# Patient Record
Sex: Female | Born: 1950 | Race: White | Hispanic: No | Smoking: Never smoker
Health system: Southern US, Community
[De-identification: ages and names within clinical notes are randomized; demographics above are authoritative.]

## PROBLEM LIST (undated history)

## (undated) DIAGNOSIS — I10 Essential (primary) hypertension: Secondary | ICD-10-CM

## (undated) DIAGNOSIS — R42 Dizziness and giddiness: Secondary | ICD-10-CM

## (undated) DIAGNOSIS — Z1159 Encounter for screening for other viral diseases: Secondary | ICD-10-CM

## (undated) DIAGNOSIS — K219 Gastro-esophageal reflux disease without esophagitis: Principal | ICD-10-CM

## (undated) DIAGNOSIS — R2689 Other abnormalities of gait and mobility: Principal | ICD-10-CM

## (undated) DIAGNOSIS — F419 Anxiety disorder, unspecified: Secondary | ICD-10-CM

---

## 2012-10-19 ENCOUNTER — Emergency Department (HOSPITAL_COMMUNITY): Payer: Medicare HMO

## 2012-10-19 ENCOUNTER — Emergency Department (HOSPITAL_COMMUNITY)
Admission: EM | Admit: 2012-10-19 | Discharge: 2012-10-21 | Disposition: A | Discharge status: Other Acute Inpatient Hospital | Payer: Medicare HMO | Attending: Emergency Medicine | Admitting: Emergency Medicine

## 2012-10-19 ENCOUNTER — Encounter (HOSPITAL_COMMUNITY): Payer: Self-pay | Admitting: Emergency Medicine

## 2012-10-19 DIAGNOSIS — I10 Essential (primary) hypertension: Secondary | ICD-10-CM | POA: Insufficient documentation

## 2012-10-19 DIAGNOSIS — F29 Unspecified psychosis not due to a substance or known physiological condition: Secondary | ICD-10-CM | POA: Insufficient documentation

## 2012-10-19 DIAGNOSIS — Z880 Allergy status to penicillin: Secondary | ICD-10-CM | POA: Insufficient documentation

## 2012-10-19 DIAGNOSIS — N949 Unspecified condition associated with female genital organs and menstrual cycle: Secondary | ICD-10-CM | POA: Insufficient documentation

## 2012-10-19 DIAGNOSIS — K6289 Other specified diseases of anus and rectum: Secondary | ICD-10-CM | POA: Insufficient documentation

## 2012-10-19 HISTORY — DX: Essential (primary) hypertension: I10

## 2012-10-19 LAB — CBC WITH DIFFERENTIAL/PLATELET
Basophils Relative: 0 % (ref 0–1)
HCT: 40.5 % (ref 36.0–46.0)
Hemoglobin: 14.1 g/dL (ref 12.0–15.0)
Lymphocytes Relative: 26 % (ref 12–46)
Lymphs Abs: 2.2 10*3/uL (ref 0.7–4.0)
MCHC: 34.8 g/dL (ref 30.0–36.0)
Monocytes Relative: 7 % (ref 3–12)
Neutro Abs: 4.8 10*3/uL (ref 1.7–7.7)
Neutrophils Relative %: 58 % (ref 43–77)
RBC: 4.52 MIL/uL (ref 3.87–5.11)
WBC: 8.3 10*3/uL (ref 4.0–10.5)

## 2012-10-19 LAB — COMPREHENSIVE METABOLIC PANEL
Albumin: 4 g/dL (ref 3.5–5.2)
Alkaline Phosphatase: 86 U/L (ref 39–117)
BUN: 15 mg/dL (ref 6–23)
CO2: 29 mEq/L (ref 19–32)
Chloride: 100 mEq/L (ref 96–112)
GFR calc non Af Amer: 61 mL/min — ABNORMAL LOW (ref >90)
Potassium: 3.6 mEq/L (ref 3.5–5.1)
Total Bilirubin: 0.3 mg/dL (ref 0.3–1.2)

## 2012-10-19 LAB — GLUCOSE, CAPILLARY: Glucose-Capillary: 122 mg/dL — ABNORMAL HIGH (ref 70–99)
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
## 2012-10-19 NOTE — ED Notes (Signed)
Assumed care of patient at this time. Officer at the bedside at this time. Labs and urine ordered. Pt assessed by MD. PT undressed assisted MD to check pt bottom. Pt check by security.
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
## 2012-10-20 LAB — RAPID URINE DRUG SCREEN, HOSP PERFORMED
Barbiturates: NOT DETECTED
Opiates: NOT DETECTED
Tetrahydrocannabinol: NOT DETECTED

## 2012-10-20 LAB — URINALYSIS, ROUTINE W REFLEX MICROSCOPIC
Glucose, UA: NEGATIVE mg/dL
Leukocytes, UA: NEGATIVE
Protein, ur: NEGATIVE mg/dL
Urobilinogen, UA: 0.2 mg/dL (ref 0.0–1.0)

## 2012-10-20 MED ORDER — LORAZEPAM 1 MG PO TABS: 1.0000 mg | ORAL_TABLET | Freq: Three times a day (TID) | ORAL | Status: DC | PRN

## 2012-10-20 MED ORDER — DIAZEPAM 5 MG PO TABS
5.0000 mg | ORAL_TABLET | Freq: Three times a day (TID) | ORAL | Status: DC
  Administered 2012-10-20 – 2012-10-21 (×4): 5 mg via ORAL
  Filled 2012-10-20 (×4): qty 1

## 2012-10-20 MED ORDER — DILTIAZEM HCL ER COATED BEADS 240 MG PO CP24
240.0000 mg | ORAL_CAPSULE | Freq: Every day | ORAL | Status: DC
  Administered 2012-10-20 – 2012-10-21 (×2): 240 mg via ORAL
  Filled 2012-10-20 (×3): qty 1

## 2012-10-20 MED ORDER — ONDANSETRON HCL 4 MG PO TABS: 4.0000 mg | ORAL_TABLET | Freq: Three times a day (TID) | ORAL | Status: DC | PRN

## 2012-10-20 MED ORDER — ACETAMINOPHEN 325 MG PO TABS: 650.0000 mg | ORAL_TABLET | ORAL | Status: DC | PRN

## 2012-10-20 MED ORDER — MECLIZINE HCL 12.5 MG PO TABS
25.0000 mg | ORAL_TABLET | Freq: Three times a day (TID) | ORAL | Status: DC
  Administered 2012-10-20 – 2012-10-21 (×4): 25 mg via ORAL
  Filled 2012-10-20: qty 1
  Filled 2012-10-20 (×3): qty 2

## 2012-10-20 MED ORDER — TRIAMTERENE-HCTZ 37.5-25 MG PO TABS
1.0000 | ORAL_TABLET | Freq: Every day | ORAL | Status: DC
  Administered 2012-10-20 – 2012-10-21 (×2): 1 via ORAL
  Filled 2012-10-20 (×3): qty 1

## 2012-10-20 MED ORDER — MELOXICAM 7.5 MG PO TABS
7.5000 mg | ORAL_TABLET | Freq: Two times a day (BID) | ORAL | Status: DC
  Administered 2012-10-20 – 2012-10-21 (×3): 7.5 mg via ORAL
  Filled 2012-10-20 (×5): qty 1

## 2012-10-20 MED ORDER — POTASSIUM CHLORIDE CRYS ER 20 MEQ PO TBCR
20.0000 meq | EXTENDED_RELEASE_TABLET | Freq: Two times a day (BID) | ORAL | Status: DC
  Administered 2012-10-20 – 2012-10-21 (×3): 20 meq via ORAL
  Filled 2012-10-20 (×3): qty 1

## 2012-10-20 NOTE — ED Notes (Signed)
Pt daughter and pt's daughter caretakers just left pt bedside and left contact information to receive pt updates. Pt's daughter's caretakers, Channing Mutters and Dianna Rossetti, can be reached at [phone].

## 2012-10-20 NOTE — ED Notes (Signed)
[employer] with pt contacted supervisor and found out that pt daughter was seen at [facility] but is no longer there. Pt aware via [employer] officer. Pt verbalized understanding.

## 2012-10-20 NOTE — ED Notes (Signed)
Pt refusing vitals, would not allow tech to take, threw blanket in the floor.

## 2012-10-20 NOTE — ED Notes (Signed)
Ask pt about home med list, pt will not tell us what she takes, states she does not want food, water, will not take any medication. Pt will not let me turn lights out, she wants her daughter feels like her daughter is not been cared for.
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
## 2012-10-20 NOTE — BH Assessment (Addendum)
[facility] geropsych unit has available beds per Trey Paula. Writer faxed referral to [facility] at [phone]. Earlene Plater Reg called back to ask for IVC paperwork. Writer called pt's RN Victorino Dike and Victorino Dike will fax IVC paperwork to Eber Jones [phone]  At capacity: [facility]  Evette Cristal, [place] Assessment Counselor

## 2012-10-20 NOTE — ED Provider Notes (Signed)
Pt continues delusional. IVC paperwork completed. Police at bedside. Pending placement at [facility] 400 hall bed.   Laray Anger, DO 10/20/12 [phone]

## 2012-10-20 NOTE — ED Notes (Addendum)
Pt talking with [facility] department and voiced concern for her daughter. Pt requesting to speak to a Child psychotherapist. Spoke with Press photographer. Charge Nurse reported Child psychotherapist comes in around 830. Pt informed social worker will get here around 830. Pt verbalized understanding.

## 2012-10-20 NOTE — ED Notes (Signed)
Pt daughter and caretakers at bedside. [facility] at bedside.

## 2012-10-20 NOTE — ED Notes (Signed)
telepscych in progress

## 2012-10-20 NOTE — ED Notes (Signed)
Still trying to get in touch with case management.

## 2012-10-20 NOTE — ED Notes (Signed)
Behavioral health called they will work on placement. Spoke with Dr Bebe Shaggy per MD, secretary is working on [employer]. Pt is crying she feels like we are putting her away to take away her daughter. Per officer daughter had a seizure after pt was taken away. She was taken to [facility] hosp.
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
## 2012-10-20 NOTE — ED Notes (Addendum)
Called [facility] for update on pt placement. Tom reported pt is accepted to 400 hall at [facility]. Tom reported no discharges on 400 hall and is unsure when the next discharge will be from that hall. Tom reported would call back if bed became available. RSD and Charge aware.
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
## 2012-10-20 NOTE — ED Notes (Addendum)
Per [facility], [place] requesting IVC paperwork in order to see if pt qualifies for treatment at their facility.

## 2012-10-20 NOTE — ED Notes (Signed)
Patient now hungry - meal prepared (healthy choice frozen dinner)

## 2012-10-20 NOTE — ED Notes (Signed)
Pt in hospital gown, pt to large of paper scrubs, ambulatory to the bathroom, pt refuse socks, officer assisted with pt.

## 2012-10-20 NOTE — ED Notes (Signed)
Called social work and inquired about pt's concern of her daughter. Social work reported there wasn't anything that they could do at this time. Pt informed that once case manager arrives at 9 the question will be addressed then as well. Pt informed and verbalized understanding.

## 2012-10-21 NOTE — ED Notes (Signed)
Pt requesting to speak to someone about taking care of her daughter while she is committed.  Contacted case management.

## 2012-10-21 NOTE — ED Notes (Signed)
Walked with patient to restroom and back to her room with no issues. Patient is very pleasant and talkative at this time. States that she enjoyed the drink and graham crackers the nurse gave her.

## 2012-10-21 NOTE — ED Notes (Signed)
Ate 100% of meal

## 2012-10-21 NOTE — BH Assessment (Addendum)
[facility] Assessment Progress Note Update:  Although pt has been accepted pending a bed at [facility], this clinician also called the following facilities to consider pt for placement:  [facility] Bride per [place]arl[place] @ [phone] - Referral faxed for review [facility] - No beds per [facility] @ [address] per [place] @ [phone] - Referral faxed for review [facility] - No beds @ 0944 [facility] - No beds per [facility] @ [address] - Beds per [place] @ [phone] - Referral faxed for review [place] - [phone] - Beds per Marchelle Folks - Referral faxed for review [place] - No beds per [facility] @ 1027  TTS staff will continue to follow up on pt referrals.

## 2012-10-21 NOTE — ED Notes (Signed)
Assisted pt to restroom  

## 2012-10-21 NOTE — ED Provider Notes (Signed)
Patient accepted to [facility] by Dr. Rowe Clack. BP 188/80  Pulse 68  Temp(Src) 97.9 F (36.6 C) (Oral)  Resp 20  Ht 5\' 6"  (1.676 m)  Wt 280 lb (127.007 kg)  BMI 45.21 kg/m2  SpO2 98%   Glynn Octave, MD 10/21/12 2224

## 2012-10-21 NOTE — ED Notes (Signed)
Spoke with Baxter Hire at [facility] - will talk with her [place] about pt placement and call back with bed assignment.

## 2012-10-22 ENCOUNTER — Encounter (HOSPITAL_COMMUNITY): Payer: Self-pay

## 2012-10-22 ENCOUNTER — Inpatient Hospital Stay (HOSPITAL_COMMUNITY)
Admission: AD | Admit: 2012-10-22 | Discharge: 2012-10-27 | DRG: 885 | Disposition: A | Discharge status: Home / Self Care | Payer: Medicare HMO | Source: Intra-hospital | Attending: Psychiatry | Admitting: Psychiatry

## 2012-10-22 DIAGNOSIS — F3110 Bipolar disorder, current episode manic without psychotic features, unspecified: Secondary | ICD-10-CM | POA: Diagnosis present

## 2012-10-22 DIAGNOSIS — F22 Delusional disorders: Secondary | ICD-10-CM | POA: Diagnosis present

## 2012-10-22 DIAGNOSIS — I10 Essential (primary) hypertension: Secondary | ICD-10-CM | POA: Diagnosis present

## 2012-10-22 MED ORDER — DILTIAZEM HCL ER COATED BEADS 240 MG PO CP24
240.0000 mg | ORAL_CAPSULE | Freq: Every day | ORAL | Status: DC
  Administered 2012-10-22 – 2012-10-25 (×4): 240 mg via ORAL
  Filled 2012-10-22 (×6): qty 1

## 2012-10-22 MED ORDER — ACETAMINOPHEN 325 MG PO TABS
650.0000 mg | ORAL_TABLET | Freq: Four times a day (QID) | ORAL | Status: DC | PRN
  Administered 2012-10-22 – 2012-10-26 (×9): 650 mg via ORAL

## 2012-10-22 MED ORDER — RISPERIDONE 1 MG PO TBDP
1.0000 mg | ORAL_TABLET | Freq: Two times a day (BID) | ORAL | Status: DC
  Administered 2012-10-22 – 2012-10-24 (×4): 1 mg via ORAL
  Filled 2012-10-22 (×9): qty 1

## 2012-10-22 MED ORDER — TRIAMTERENE-HCTZ 37.5-25 MG PO TABS
1.0000 | ORAL_TABLET | Freq: Every day | ORAL | Status: DC
  Administered 2012-10-22 – 2012-10-25 (×4): 1 via ORAL
  Filled 2012-10-22 (×6): qty 1

## 2012-10-22 MED ORDER — TRAZODONE HCL 50 MG PO TABS
50.0000 mg | ORAL_TABLET | Freq: Every evening | ORAL | Status: DC | PRN
  Administered 2012-10-22 – 2012-10-26 (×5): 50 mg via ORAL
  Filled 2012-10-22 (×2): qty 1
  Filled 2012-10-22: qty 6
  Filled 2012-10-22 (×2): qty 1

## 2012-10-22 MED ORDER — MECLIZINE HCL 25 MG PO TABS
25.0000 mg | ORAL_TABLET | Freq: Three times a day (TID) | ORAL | Status: DC
  Administered 2012-10-22 – 2012-10-25 (×12): 25 mg via ORAL
  Filled 2012-10-22 (×16): qty 1

## 2012-10-22 MED ORDER — ALUM & MAG HYDROXIDE-SIMETH 200-200-20 MG/5ML PO SUSP: 30.0000 mL | ORAL | Status: DC | PRN

## 2012-10-22 MED ORDER — MELOXICAM 7.5 MG PO TABS
7.5000 mg | ORAL_TABLET | Freq: Two times a day (BID) | ORAL | Status: DC
  Administered 2012-10-22 (×2): 7.5 mg via ORAL
  Filled 2012-10-22 (×4): qty 1

## 2012-10-22 MED ORDER — DIAZEPAM 5 MG PO TABS
5.0000 mg | ORAL_TABLET | Freq: Three times a day (TID) | ORAL | Status: DC
  Administered 2012-10-22 – 2012-10-25 (×12): 5 mg via ORAL
  Filled 2012-10-22 (×12): qty 1

## 2012-10-22 MED ORDER — MELOXICAM 15 MG PO TABS
15.0000 mg | ORAL_TABLET | Freq: Two times a day (BID) | ORAL | Status: DC
  Administered 2012-10-23: 15 mg via ORAL
  Filled 2012-10-22 (×3): qty 1

## 2012-10-22 MED ORDER — MAGNESIUM HYDROXIDE 400 MG/5ML PO SUSP
30.0000 mL | Freq: Every day | ORAL | Status: DC | PRN
  Administered 2012-10-22 – 2012-10-23 (×2): 30 mL via ORAL

## 2012-10-22 NOTE — Progress Notes (Signed)
D-Patient is pleasant but speech is tangental and pressured.  She rambles from topic to topic.  She is compliant with scheduled medications and no prn medications requested.  She rates depression and hopelessness at 0 and denies SI.  Remains a fall risk and using walker.  A- Support and encouragement offered.  Continue POC and evaluation of treatment goals. Continue 15' checks for safety.  Redirect mania with therapeutic communication.  R- Remains safe.

## 2012-10-22 NOTE — Progress Notes (Addendum)
Provider informed of increased BP.  Will continue to assess manual BP's and report to oncoming shfit.  Patient is alert and denies HA and blurred vision.

## 2012-10-22 NOTE — [facility] Suicide Risk Assessment (Signed)
Suicide Risk Assessment  Admission Assessment     Nursing information obtained from:  Patient Demographic factors:  Caucasian;Unemployed;Access to firearms Current Mental Status:  NA Loss Factors:  Financial problems / change in socioeconomic status;Legal issues Historical Factors:  Impulsivity Risk Reduction Factors:  Positive social support  CLINICAL FACTORS:   Depression:   Impulsivity. Paranoid and delusional. Impaired insight.  COGNITIVE FEATURES THAT CONTRIBUTE TO RISK:  Polarized thinking    SUICIDE RISK:   Severe:  Frequent, intense, and enduring suicidal ideation, specific plan, no subjective intent, but some objective markers of intent (i.e., choice of lethal method), the method is accessible, some limited preparatory behavior, evidence of impaired self-control, severe dysphoria/symptomatology, multiple risk factors present, and few if any protective factors, particularly a lack of social support.  PLAN OF CARE:  I certify that inpatient services furnished can reasonably be expected to improve the patient's condition.  Brittney Mitchell 10/22/2012, 12:18 PM
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
## 2012-10-22 NOTE — Tx Team (Signed)
Initial Interdisciplinary Treatment Plan  PATIENT STRENGTHS: (choose at least two) General fund of knowledge Supportive family/friends  PATIENT STRESSORS: Health problems Medication change or noncompliance   PROBLEM LIST: Problem List/Patient Goals Date to be addressed Date deferred Reason deferred Estimated date of resolution  Psychosis 10/22/12     Depression 10/22/12     Risk for suicide 10/22/12                                          DISCHARGE CRITERIA:  Adequate post-discharge living arrangements Improved stabilization in mood, thinking, and/or behavior  PRELIMINARY DISCHARGE PLAN: Attend PHP/IOP Outpatient therapy  PATIENT/FAMIILY INVOLVEMENT: This treatment plan has been presented to and reviewed with the patient, Brittney Mitchell.  The patient and family have been given the opportunity to ask questions and make suggestions.  Jacques Navy A 10/22/2012, 3:25 AM

## 2012-10-22 NOTE — [facility] Group Notes (Signed)
[facility] Group Notes:  (Clinical Social Work)  10/22/2012   2:00-2:30PM  Summary of Progress/Problems:   The main focus of today's process group was to explain to the adolescent what "self-sabotage" means and use Motivational Interviewing to discuss what benefits, negative or positive, were involved in a self-identified self-sabotaging behavior.  We then talked about reasons the patient may want to change the behavior and her current desire to change.  A scaling question was used to help patient look at where they are now in motivation for change, from 1 to 10 (lowest to highest motivation).  The patient expressed that when she has a problem or feels bad, she tells herself that if she talks about it, nobody will listen, so she just keeps things to herself.  She nodded to many other comments made by other patients in group, as though she agreed.  She smiled incongruently with her depressed mood.  Type of Therapy:  Group Therapy - Process   Participation Level:  Active  Participation Quality:  Attentive  Affect:  Not Congruent  Cognitive:  Confused  Insight:  Improving  Engagement in Therapy:  Engaged  Modes of Intervention:  Support and Processing, Exploration, Discussion  Ambrose Mantle, LCSW 10/22/2012, 12:39 PM
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
## 2012-10-22 NOTE — [facility] Group Notes (Signed)
[facility] Group Notes:  (Nursing/MHT/Case Management/Adjunct)  Date:  10/22/2012  Time:  10:49 AM  Type of Therapy:  Nurse Education  Participation Level:  Did Not Attend Participation Quality:    Affect:    Cognitive:    Insight:    Engagement in Group:    Modes of Intervention:    Summary of Progress/Problems:  Cresenciano Lick 10/22/2012, 10:49 AM

## 2012-10-23 MED ORDER — MELOXICAM 7.5 MG PO TABS
7.5000 mg | ORAL_TABLET | Freq: Two times a day (BID) | ORAL | Status: DC
  Administered 2012-10-23 – 2012-10-25 (×5): 7.5 mg via ORAL
  Filled 2012-10-23 (×8): qty 1

## 2012-10-23 NOTE — Progress Notes (Signed)
D- Patient is bright and appropriate. She is attending groups with enthusiasm.  Although she is logical and coherent, she exhibits some hypomania but no redirection  Is necessary.  C/O knee pain r/t arthritis.  Covered well by scheduled mobic.  A- Support and encouragement given.  Continue current POC and evaluation of treatment goals. Continue 15' checks for safety.  R- Safety maintained.

## 2012-10-23 NOTE — [facility] Group Notes (Signed)
[facility] Group Notes:  (Clinical Social Work)  10/23/2012   11:15am-12:00pm  Summary of Progress/Problems:  The main focus of today's process group was to listen to a variety of genres of music and to identify that different types of music provoke different responses.  The patient then was able to identify personally what was soothing for them, as well as energizing.  Handouts were used to record feelings evoked, as well as how patient can personally use this knowledge in sleep habits, with depression, and with other symptoms.  The patient expressed understanding of concepts, as well as knowledge of how each type of music affected them and how this can be used when they are at home as a tool in their recovery.  She enjoyed the [employer] a great deal, and smiked throughout group.  Type of Therapy:  Music Therapy   Participation Level:  Active  Participation Quality:  Attentive and Sharing  Affect:  Blunted  Cognitive:  Oriented  Insight:  Engaged  Engagement in Therapy:  Engaged  Modes of Intervention:   Activity, Exploration  Ambrose Mantle, LCSW 10/23/2012, 12:48 PM

## 2012-10-23 NOTE — Progress Notes (Signed)
[facility] Group Notes:  (Nursing/MHT/Case Management/Adjunct)  Date:  10/23/2012  Time:  2000  Type of Therapy:  Psychoeducational Skills  Participation Level:  Active  Participation Quality:  Appropriate  Affect:  Appropriate  Cognitive:  Appropriate  Insight:  Improving  Engagement in Group:  Improving  Modes of Intervention:  Education  Summary of Progress/Problems: The patient stated that she had a "great day". She mentioned that her knees are no longer causing her any physical discomfort. In addition, she mentioned that she had a good over the telephone with her family. Her goal for tomorrow is to have another good day.   Hazle Coca S 10/23/2012, 11:35 PM
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
## 2012-10-23 NOTE — Progress Notes (Signed)
[facility] MD Progress Note  10/23/2012 11:26 AM NATAYAH Mitchell  MRN:  [id number] Subjective:  Sat down with no distress.  Objective: Mood elevated with poor insight why she is here. Still feels writing a book and that her daughter was in danger. Somewhat delusional and elevated mood. Diagnosis:   DSM5: Schizophrenia Disorders:  Delusional Disorder (297.1) Obsessive-Compulsive Disorders:  NA Trauma-Stressor Disorders:  NA Substance/Addictive Disorders:  NA Depressive Disorders:  Disruptive Mood Dysregulation Disorder (296.99)  Axis I: Bipolar, Manic and Psychotic Disorder NOS  ADL's:  Impaired  Sleep: Fair  Appetite:  Fair  Suicidal Ideation:  Plan:  none Intent:  intent Homicidal Ideation:  Plan:  none AEB (as evidenced by):  Psychiatric Specialty Exam: Review of Systems  Psychiatric/Behavioral: Positive for depression.    Blood pressure 128/77, pulse 78, temperature 97.8 F (36.6 C), temperature source Oral, resp. rate 24, height 5\' 6"  (1.676 m), weight 130.636 kg (288 lb).Body mass index is 46.51 kg/(m^2).  General Appearance: Casual  Eye Contact::  Good  Speech:  Clear and Coherent  Volume:  Increased  Mood:  Euphoric  Affect:  Congruent  Thought Process:  Disorganized  Orientation:  Full (Time, Place, and Person)  Thought Content:  Delusions  Suicidal Thoughts:  No  Homicidal Thoughts:  No  Memory:  Immediate;   Fair  Judgement:  Impaired  Insight:  Lacking  Psychomotor Activity:  Normal  Concentration:  Fair  Recall:  Fair  Akathisia:  No  Handed:  Right  AIMS (if indicated):     Assets:  Desire for Improvement  Sleep:  Number of Hours: 6.75   Current Medications: Current Facility-Administered Medications  Medication Dose Route Frequency Provider Last Rate Last Dose  . acetaminophen (TYLENOL) tablet 650 mg  650 mg Oral Q6H PRN Sanjuana Kava, NP   650 mg at 10/23/12 0640  . alum & mag hydroxide-simeth (MAALOX/MYLANTA) 200-200-20 MG/5ML suspension 30 mL  30  mL Oral Q4H PRN Court Joy, PA-C      . diazepam (VALIUM) tablet 5 mg  5 mg Oral TID Court Joy, PA-C   5 mg at 10/23/12 0751  . diltiazem (CARDIZEM CD) 24 hr capsule 240 mg  240 mg Oral Daily Court Joy, PA-C   240 mg at 10/23/12 0751  . magnesium hydroxide (MILK OF MAGNESIA) suspension 30 mL  30 mL Oral Daily PRN Court Joy, PA-C   30 mL at 10/22/12 2240  . meclizine (ANTIVERT) tablet 25 mg  25 mg Oral TID Court Joy, PA-C   25 mg at 10/23/12 0751  . meloxicam (MOBIC) tablet 15 mg  15 mg Oral BID Nanine Means, NP   15 mg at 10/23/12 0750  . risperiDONE (RISPERDAL M-TABS) disintegrating tablet 1 mg  1 mg Oral BID Nanine Means, NP   1 mg at 10/23/12 0751  . traZODone (DESYREL) tablet 50 mg  50 mg Oral QHS PRN,MR X 1 Sanjuana Kava, NP   50 mg at 10/22/12 2229  . triamterene-hydrochlorothiazide (MAXZIDE-25) 37.5-25 MG per tablet 1 tablet  1 tablet Oral Daily Court Joy, PA-C   1 tablet at 10/23/12 4098    Lab Results: No results found for this or any previous visit (from the past 48 hour(s)).  Physical Findings: AIMS: Facial and Oral Movements Muscles of Facial Expression: None, normal Lips and Perioral Area: None, normal Jaw: None, normal Tongue: None, normal,Extremity Movements Upper (arms, wrists, hands, fingers): None, normal Lower (legs, knees, ankles, toes):  None, normal, Trunk Movements Neck, shoulders, hips: None, normal, Overall Severity Severity of abnormal movements (highest score from questions above): None, normal Incapacitation due to abnormal movements: None, normal Patient's awareness of abnormal movements (rate only patient's report): No Awareness, Dental Status Current problems with teeth and/or dentures?: No Does patient usually wear dentures?: Yes (partial upper)  CIWA:    COWS:     Treatment Plan Summary: Daily contact with patient to assess and evaluate symptoms and progress in treatment Medication management. Have started on  Risperdal M tablet which has shown benefit in sleep and mood symptoms. Less evasive now.. Vitals stable.  Plan: Continue above treatment   Medical Decision Making Problem Points:  Established problem, stable/improving (1), Review of last therapy session (1) and Review of psycho-social stressors (1) Data Points:  Review or order clinical lab tests (1) Review or order medicine tests (1) Review of medication regiment & side effects (2)  I certify that inpatient services furnished can reasonably be expected to improve the patient's condition.   [name] 10/23/2012, 11:26 AM

## 2012-10-24 DIAGNOSIS — F29 Unspecified psychosis not due to a substance or known physiological condition: Secondary | ICD-10-CM

## 2012-10-24 DIAGNOSIS — F3110 Bipolar disorder, current episode manic without psychotic features, unspecified: Secondary | ICD-10-CM

## 2012-10-24 MED ORDER — RISPERIDONE 2 MG PO TBDP
2.0000 mg | ORAL_TABLET | Freq: Two times a day (BID) | ORAL | Status: DC
  Administered 2012-10-24 – 2012-10-25 (×3): 2 mg via ORAL
  Filled 2012-10-24 (×6): qty 1
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
## 2012-10-24 NOTE — Progress Notes (Signed)
Recreation Therapy Notes  Date: 09.08.2014 Time: 9:30am Location: [address] Dayroom  Group Topic: Wellness  Goal Area(s) Addresses:  Patient will define components of whole wellness. Patient will verbalize benefit of whole wellness.  Behavioral Response: Engaged, Attentive, Appropriate  Intervention: Air traffic controller  Activity: [employer]. Patients were given a worksheet with the six dimensions of wellness: Physical, Social, Intellectual, Emotional, Environmental, and Spiritual. Patients were asked to identify two ways they address each dimension.   Education: Wellness, Building control surveyor.   Education Outcome: Acknowledges understanding.   Clinical Observations/Feedback: Patient participated in opening discussion, sharing that wellness means being able to get up and walk around with no pain. Patient completed worksheet as requested, but did not share with group. Patient did not contribute to group discussion, but appeared to actively listen as she maintained appropriate eye contact with speaker.   Marykay Lex Christien Berthelot, LRT/CTRS  Arliss Hepburn L 10/24/2012 1:57 PM

## 2012-10-24 NOTE — Progress Notes (Signed)
Adult Psychoeducational Group Note  Date:  10/24/2012 Time:  1:46 PM  Group Topic/Focus:  Dimensions of Wellness:   The focus of this group is to introduce the topic of wellness and discuss the role each dimension of wellness plays in total health.  Participation Level:  Active  Participation Quality:  Appropriate  Affect:  Appropriate  Cognitive:  Alert and Appropriate  Insight: Appropriate  Engagement in Group:  Engaged  Modes of Intervention:  Discussion  Additional Comments:  Pt was active and alert during group and was very engaged. Pt mentioned that her social goal is to spend more time with her daughter and to lose some weight.  [place] Shi K 10/24/2012, 1:46 PM

## 2012-10-24 NOTE — Progress Notes (Signed)
Patient in day room interacting with peers at the beginning of the shift. Her mood and affect bright and appropriate. She talked about the event that brought her to the hospital. York Spaniel her neighbor is "[employer]" and very hostile. Patient denied SI/HI and denied hallucinations. Q 15 minute check continues as ordered to maintain safety.
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
## 2012-10-24 NOTE — Progress Notes (Signed)
Pt has had a good evening though feels mobic is not covering her arthritic knee pain. Attended group and interacted with peers appropriately. Mood is elevated with some anxiety. Affect bright. Pt medicated with tyelnol and trazadone with good results. Pt assisted into the bath tub and clothes were obtained for her from the unit's donated items. Pt very appreciative. Ambulating with walker and verbalizes understanding of fall precautions. Denies SI/HI/aVH and remains safe. Lawrence Marseilles

## 2012-10-24 NOTE — Progress Notes (Addendum)
Patient ID: Brittney Mitchell, female   DOB: 1950/06/11, 62 y.o.   MRN: [id number] D:Patient presents with bright affect.  She is interacting well with staff and others. Patient is attending groups and participating in her treatment.  She remains paranoid of her neighbor thinking he is spying on her. Believes that her neighbor is a member of the [employer]. She denies any HI/SI/AVH. She complained of pain due to her arthritis, however states that mobic is working well.  A:She continues to ambulate with a walker to due fall risk.  Continue to monitor medication management and MD orders.  Safety checks completed every 15 minutes per protocol.  R:Patient's behavior has been appropriate.

## 2012-10-24 NOTE — [facility] Group Notes (Signed)
[facility] LCSW Group Therapy  10/24/2012 1:15 pm  Type of Therapy: Process Group Therapy  Participation Level:  Active  Participation Quality:  Appropriate  Affect:  Flat  Cognitive:  Oriented  Insight:  Limited  Engagement in Group:  Limited  Engagement in Therapy:  Limited  Modes of Intervention:  Activity, Clarification, Education, Problem-solving and Support  Summary of Progress/Problems: Today's group addressed the issue of overcoming obstacles.  Patients were asked to identify their biggest obstacle post d/c that stands in the way of their on-going success, and then problem solve as to how to manage this.  Brittney Mitchell continued her theme of paranoia by talking about how the media does not like Jews and "we must all protect ourselves."  However, she also is very invested in giving supportive feedback to others, which is received in the manner in which it is intended.   Brittney Mitchell 10/24/2012   4:28 PM
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
## 2012-10-25 MED ORDER — DIAZEPAM 5 MG PO TABS
5.0000 mg | ORAL_TABLET | Freq: Three times a day (TID) | ORAL | Status: DC
  Administered 2012-10-26 – 2012-10-27 (×5): 5 mg via ORAL
  Filled 2012-10-25 (×5): qty 1

## 2012-10-25 MED ORDER — MELOXICAM 7.5 MG PO TABS
7.5000 mg | ORAL_TABLET | Freq: Two times a day (BID) | ORAL | Status: DC
  Administered 2012-10-26 – 2012-10-27 (×3): 7.5 mg via ORAL
  Filled 2012-10-25 (×7): qty 1

## 2012-10-25 MED ORDER — MECLIZINE HCL 25 MG PO TABS
25.0000 mg | ORAL_TABLET | Freq: Three times a day (TID) | ORAL | Status: DC
  Administered 2012-10-26 – 2012-10-27 (×5): 25 mg via ORAL
  Filled 2012-10-25 (×10): qty 1

## 2012-10-25 MED ORDER — RISPERIDONE 2 MG PO TBDP
2.0000 mg | ORAL_TABLET | Freq: Two times a day (BID) | ORAL | Status: DC
  Administered 2012-10-26 – 2012-10-27 (×3): 2 mg via ORAL
  Filled 2012-10-25 (×3): qty 1
  Filled 2012-10-25 (×2): qty 6
  Filled 2012-10-25 (×2): qty 1

## 2012-10-25 MED ORDER — DILTIAZEM HCL ER COATED BEADS 240 MG PO CP24
240.0000 mg | ORAL_CAPSULE | Freq: Every day | ORAL | Status: DC
  Administered 2012-10-26 – 2012-10-27 (×2): 240 mg via ORAL
  Filled 2012-10-25 (×5): qty 1

## 2012-10-25 MED ORDER — TRIAMTERENE-HCTZ 37.5-25 MG PO TABS
1.0000 | ORAL_TABLET | Freq: Every day | ORAL | Status: DC
  Administered 2012-10-26 – 2012-10-27 (×2): 1 via ORAL
  Filled 2012-10-25 (×5): qty 1

## 2012-10-25 NOTE — Clinical Social Work Note (Signed)
Spoke with support, Futures trader at [phone].  He reports a 25 year history with pt, and describes years of paranoia, culminating in several moves to "get away from people spying on her."  Confirms that as far as he knows, she has avoided hospitals and clinics.  He will continue to support her and give her a ride home at d/c.  States she is sounding a bit better on the phone as they talk daily.
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
## 2012-10-25 NOTE — [facility] Group Notes (Signed)
[facility] LCSW Group Therapy  10/25/2012 , 2:56 PM   Type of Therapy:  Group Therapy  Participation Level:  Active  Participation Quality:  Attentive  Affect:  Appropriate  Cognitive:  Alert  Insight:  Improving  Engagement in Therapy:  Engaged  Modes of Intervention:  Discussion, Exploration and Socialization  Summary of Progress/Problems: Today's group focused on the term Diagnosis.  Participants were asked to define the term, and then pronounce whether it is a negative, positive or neutral term.  Sibley was attentive and engaged throughout group.  She talked about diagnosis in terms of finding out what is wrong so you can get the treatment you need.  She also talked about the importance of having good boundaries, and only telling people that you know and trust about specifics.  She seemed to appreciate hearing from others who said its OK to just tell people that you were in the hospital for help with recovery, and its OK to not tell them anything else.  Daryel Gerald B 10/25/2012 , 2:56 PM

## 2012-10-25 NOTE — Progress Notes (Signed)
Seen and agreed. Amai Cappiello, MD 

## 2012-10-25 NOTE — [facility] Group Notes (Signed)
Adult Psychoeducational Group Note  Date:  10/25/2012 Time:  9:53 PM  Group Topic/Focus:  Wrap-Up Group:   The focus of this group is to help patients review their daily goal of treatment and discuss progress on daily workbooks.  Participation Level:  Minimal  Participation Quality:  Appropriate  Affect:  Appropriate  Cognitive:  Appropriate  Insight: Appropriate  Engagement in Group:  Limited  Modes of Intervention:  Discussion  Additional Comments:  Brittney Mitchell stated that her day started off bad because she was feeling dizzy.  Her goal was to make it through the day.  She got a call from her daughter and her daughter told her she would be coming to visit her.  [name] A 10/25/2012, 9:53 PM

## 2012-10-25 NOTE — Progress Notes (Addendum)
D: Patient in day room at the beginning of the shift interacting with peers. She endorsed a good day; although reported she was felling dizzy all day; she said she felt she might be having an ear infection or she might need to take her medications in the morning before breakfast; "that's what I do at home and it helped". Writer to check patient's v/s including temperature. Patient's day shift RN reported that she notified the physician about patient's complaint and all her medication  were changed to before breakfast. Patient requested for fish net pantie and maxi pads. She denied SI/HI and denied hallucinations; "I never had those". Mood and affect flat and depressed. She reported that her daughter and pastor's wife will be coming to visit her tomorrow. Patient seemed excited about the visit. A: Writer encouraged and supported patient. Provided maxi pads for stress incontinent and fish net as requested. R: Patient receptive to encouragement and support. Q 15 minute check continues as ordered to maintain safety.  2300: Patient's B?P elevated at HS . NP Cori notified . About an hour after patient received her HS medication; NP suggested we re-checked the B/P. It was a little better than the one taken earlier. NP said since patient's B/P has been high for days and it came down after we rechecked it; Clinical research associate should report to day Nurse to discuss it with the psychiatrist.

## 2012-10-26 DIAGNOSIS — F22 Delusional disorders: Principal | ICD-10-CM

## 2012-10-26 MED ORDER — CARBAMIDE PEROXIDE 6.5 % OT SOLN
5.0000 [drp] | Freq: Every day | OTIC | Status: DC
  Administered 2012-10-26 – 2012-10-27 (×2): 5 [drp] via OTIC
  Filled 2012-10-26: qty 15

## 2012-10-26 NOTE — Progress Notes (Signed)
Recreation Therapy Notes  Date: 09.10.2014 Time: 9:30am Location: [address] dayroom  Group Topic: Leisure Education  Goal Area(s) Addresses:  Patient will verbalize activity of interest by end of group session. Patient will verbalize the ability to use positive leisure/recreation as a coping mechanism.  Behavioral Response: Attentive, Engaged, Appropriate   Intervention: Adapted Game  Activity: Adapted On Deck. Patients were asked to roll a di, if patient rolled 1-3 they were asked to select a leisure/recreation activity from container and act it out. If patient rolled 4-6 they were asked to draw the activity on the white board.     Education:  Leisure Education  Education Outcome: Needs additional education  Clinical Observations/Feedback: Patient attended group and participated in activity. Patient acted out activities from seated position, as she complained about dizziness when standing. Patient appropriately guessed things drawn or acted out by peers.   Marykay Lex Ayad Nieman, LRT/CTRS  Kaamil Morefield L 10/26/2012 7:23 PM
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
## 2012-10-26 NOTE — Progress Notes (Signed)
Adult Psychoeducational Group Note  Date:  10/26/2012 Time:  9:46 PM  Group Topic/Focus:  Wrap-Up Group:   The focus of this group is to help patients review their daily goal of treatment and discuss progress on daily workbooks.  Participation Level:  Minimal  Participation Quality:  Appropriate  Affect:  Flat  Cognitive:  Appropriate  Insight: Limited  Engagement in Group:  Engaged  Modes of Intervention:  Support  Additional Comments:  Patient attended and participated in group tonight. She reports that she will be discharged tomorrow. She advised that today was a better day for her. She received some ear drops which made her ear feel better. She had her meals on the unit due to dizziness. She attended her groups and is feeling very good about her discharge.  The patient reports that her plans when she gets home it to clean up, cook a good meal and when the [employer] do whatever they want to do she is going to say nothing to them.   Lita Mains [facility] 10/26/2012, 9:46 PM

## 2012-10-26 NOTE — [facility] Group Notes (Signed)
[facility] [employer] Group Therapy  10/26/2012 , 1:30 PM    Type of Therapy:  [employer] Presentation  Participation Level:  Active  Participation Quality:  Attentive  Affect:  Blunted  Cognitive:  Oriented  Insight:  Limited  Engagement in Therapy:  Engaged  Modes of Intervention:  Discussion, Education and Socialization  Summary of Progress/Problems:  Brittney Mitchell from [employer] came to present his recovery story and play the guitar.  Brittney Mitchell listened attentively during the presenter.  She complimented and thanked the presenter for coming.  Also asked him about his drug use and how it affected his ability to learn guitar.  Brittney Mitchell 10/26/2012 , 1:30 PM

## 2012-10-26 NOTE — Progress Notes (Signed)
Adult Psychoeducational Group Note  Date: 10/26/2012  Time: 12:07 PM  Group Topic/Focus:  Personal Choices and Values: The focus of this group is to help patients assess and explore the importance of values in their lives, how their values affect their decisions, how they express their values and what opposes their expression.  Participation Level: Active  Participation Quality: Redirectable and Sharing  Affect: Appropriate  Cognitive: Appropriate  Insight: Appropriate  Engagement in Group: Engaged and Off Topic  Modes of Intervention: Discussion, Education and Support  Additional Comments: none Kenza Munar M  10/26/2012, 12:07 PM  

## 2012-10-26 NOTE — [facility] Group Notes (Signed)
[facility] LCSW Aftercare Discharge Planning Group Note   10/26/2012 10:34 AM  Participation Quality:  Engaged  Mood/Affect:  Appropriate  Depression Rating:  Denies  Anxiety Rating:  Denies  Thoughts of Suicide:  No Will you contract for safety?   NA  Current AVH:  No  Plan for Discharge/Comments:  Khristi is doing well.  No complaints other than an ear ache.  Confirms good support from her pastor and his wife.  Says they are planning on visiting tonight.  Hopes to d/c soon.   Transportation Means: Education officer, environmental  Supports: Erin Sons, [place] B

## 2012-10-27 MED ORDER — RISPERIDONE 2 MG PO TBDP: 2.0000 mg | ORAL_TABLET | Freq: Two times a day (BID) | ORAL | Status: DC

## 2012-10-27 MED ORDER — DILTIAZEM HCL ER COATED BEADS 240 MG PO CP24: 240.0000 mg | ORAL_CAPSULE | Freq: Every day | ORAL | Status: AC

## 2012-10-27 MED ORDER — MELOXICAM 7.5 MG PO TABS: 7.5000 mg | ORAL_TABLET | Freq: Two times a day (BID) | ORAL | Status: AC

## 2012-10-27 MED ORDER — MECLIZINE HCL 25 MG PO TABS: 25.0000 mg | ORAL_TABLET | Freq: Three times a day (TID) | ORAL | Status: AC

## 2012-10-27 MED ORDER — TRAZODONE HCL 50 MG PO TABS: 50.0000 mg | ORAL_TABLET | Freq: Every evening | ORAL | Status: DC | PRN

## 2012-10-27 MED ORDER — TRIAMTERENE-HCTZ 37.5-25 MG PO TABS: 1.0000 | ORAL_TABLET | Freq: Every day | ORAL | Status: DC

## 2012-10-27 MED ORDER — DIAZEPAM 5 MG PO TABS: 5.0000 mg | ORAL_TABLET | Freq: Three times a day (TID) | ORAL | Status: AC

## 2012-10-27 NOTE — Progress Notes (Signed)
[facility] Adult Case Management Discharge Plan :  Will you be returning to the same living situation after discharge: Yes,  home At discharge, do you have transportation home?:Yes,  friend Do you have the ability to pay for your medications:Yes,  mental health  Release of information consent forms completed and in the chart;  Patient's signature needed at discharge.  Patient to Follow up at: Follow-up Information   Follow up with [facility] On 10/31/2012. (Your walk in appointment is between 8 and 10AM on Monday.  Auth #[id number])    Contact information:   [address]  [place] [phone]] [phone]      Patient denies SI/HI:   Yes,  yes    Safety Planning and Suicide Prevention discussed:  Yes,  yes  Ida Rogue 10/27/2012, 8:33 AM

## 2012-10-27 NOTE — [facility] Suicide Risk Assessment (Signed)
[facility] INPATIENT:  Family/Significant Other Suicide Prevention Education  Suicide Prevention Education:  Education Completed; No one has been identified by the patient as the family member/significant other with whom the patient will be residing, and identified as the person(s) who will aid the patient in the event of a mental health crisis (suicidal ideations/suicide attempt).  With written consent from the patient, the family member/significant other has been provided the following suicide prevention education, prior to the and/or following the discharge of the patient.  The suicide prevention education provided includes the following:  Suicide risk factors  Suicide prevention and interventions  National Suicide Hotline telephone number  [facility] assessment telephone number  [facility] Emergency Assistance 911  [facility] and/or Residential Mobile Crisis Unit telephone number  Request made of family/significant other to:  Remove weapons (e.g., guns, rifles, knives), all items previously/currently identified as safety concern.    Remove drugs/medications (over-the-counter, prescriptions, illicit drugs), all items previously/currently identified as a safety concern.  The family member/significant other verbalizes understanding of the suicide prevention education information provided.  The family member/significant other agrees to remove the items of safety concern listed above.  The patient did not endorse SI at the time of admission, nor did the patient c/o SI during the stay here.  SPE not required.   Daryel Gerald B 10/27/2012, 8:34 AM

## 2012-10-27 NOTE — Tx Team (Signed)
  Interdisciplinary Treatment Plan Update   Date Reviewed:  10/27/2012  Time Reviewed:  8:35 AM  Progress in Treatment:   Attending groups: Yes Participating in groups: Yes Taking medication as prescribed: Yes  Tolerating medication: Yes Family/Significant other contact made: Yes  Patient understands diagnosis: Yes  Discussing patient identified problems/goals with staff: Yes Medical problems stabilized or resolved: Yes Denies suicidal/homicidal ideation: Yes Patient has not harmed self or others: Yes  For review of initial/current patient goals, please see plan of care.  Estimated Length of Stay:  D/C today  Reason for Continuation of Hospitalization:   New Problems/Goals identified:  N/A  Discharge Plan or Barriers:   return home, follow up outpt  Additional Comments:  Attendees:  Signature: Thedore Mins, MD 10/27/2012 8:35 AM   Signature: Richelle Ito, LCSW 10/27/2012 8:35 AM  Signature: Fransisca Kaufmann, NP 10/27/2012 8:35 AM  Signature: Joslyn Devon, RN 10/27/2012 8:35 AM  Signature: Liborio Nixon, RN 10/27/2012 8:35 AM  Signature:  10/27/2012 8:35 AM  Signature:   10/27/2012 8:35 AM  Signature:    Signature:    Signature:    Signature:    Signature:    Signature:      Scribe for Treatment Team:   Richelle Ito, LCSW  10/27/2012 8:35 AM

## 2012-10-27 NOTE — Progress Notes (Signed)
Patient ID: Brittney Mitchell, female   DOB: [birth year], 62 y.o.   MRN: [id number] Patient discharged home per MD order.  Patient received all her personal belongings, prescriptions and medication samples.  Medications and discharge instructions were reviewed with patient.  She indicated understanding.  Patient will follow up with [facility].  She denies any SI/HI/AVH.  Patient left ambulatory with pastor and daughter.

## 2012-10-27 NOTE — Progress Notes (Signed)
Patient ID: Brittney Mitchell, female   DOB: [birth year], 62 y.o.   MRN: [id number]  D: Writer introduced herself to the pt and asked pt to describe her day. Pt stated she was "happy because she spoke to her daughter today". Stated her daughter said, "mommy I miss you". Pt stated her daughter is 57 yrs old, but "has the mind of a child". Pt stated her daughter is "epileptic". Stated her pastor and his wife are caring for her while the pt is at [facility].   A:  Support and encouragement was offered. 15 min checks continued for safety.  R: Pt remains safe.

## 2012-10-27 NOTE — [facility] Suicide Risk Assessment (Signed)
Suicide Risk Assessment  Discharge Assessment     Demographic Factors:  Low socioeconomic status, Unemployed and female  Mental Status Per Nursing Assessment::   On Admission:  NA  Current Mental Status by Physician: patient denies suicidal ideation, intent and plan  Loss Factors: Financial problems/change in socioeconomic status  Historical Factors: Impulsivity  Risk Reduction Factors:   Sense of responsibility to family and Living with another person, especially a relative  Continued Clinical Symptoms:  Resolution of delusional symptoms  Cognitive Features That Contribute To Risk:  Closed-mindedness Polarized thinking    Suicide Risk:  Minimal: No identifiable suicidal ideation.  Patients presenting with no risk factors but with morbid ruminations; may be classified as minimal risk based on the severity of the depressive symptoms  Discharge Diagnoses:   AXIS I:  Delusional disorder(297.1)  AXIS II:  Deferred AXIS III:   Past Medical History  Diagnosis Date  . Hypertension    AXIS IV:  other psychosocial or environmental problems and problems related to social environment AXIS V:  61-70 mild symptoms  Plan Of Care/Follow-up recommendations:  Activity:  as tolerated Diet:  healthy Tests:  routine Other:  patient to keep her after care appointment  Is patient on multiple antipsychotic therapies at discharge:  No   Has Patient had three or more failed trials of antipsychotic monotherapy by history:  No  Recommended Plan for Multiple Antipsychotic Therapies: N/A  Thedore Mins, MD 10/27/2012, 10:12 AM
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
## 2012-10-28 NOTE — Progress Notes (Signed)
Seen and agreed. Masiya Claassen, MD 

## 2012-10-28 NOTE — Discharge Summary (Signed)
Seen and agreed. Ivo Moga, MD 

## 2012-11-01 NOTE — Progress Notes (Signed)
Patient Discharge Instructions:  After Visit Summary (AVS):   Faxed to:  11/01/12 Discharge Summary Note:   Faxed to:  11/01/12 Psychiatric Admission Assessment Note:   Faxed to:  11/01/12 Suicide Risk Assessment - Discharge Assessment:   Faxed to:  11/01/12 Faxed/Sent to the Next Level Care provider:  11/01/12 Faxed to [facility] @ [phone]  Jerelene Redden, 11/01/2012, 2:56 PM

## 2013-01-10 ENCOUNTER — Other Ambulatory Visit (HOSPITAL_COMMUNITY): Payer: Self-pay

## 2013-01-19 ENCOUNTER — Encounter (HOSPITAL_COMMUNITY): Admission: RE | Payer: Self-pay | Source: Ambulatory Visit

## 2013-01-19 ENCOUNTER — Ambulatory Visit (HOSPITAL_COMMUNITY): Admission: RE | Admit: 2013-01-19 | Payer: Medicare HMO | Source: Ambulatory Visit | Admitting: Ophthalmology

## 2013-01-19 SURGERY — PHACOEMULSIFICATION, CATARACT, WITH IOL INSERTION
Anesthesia: Monitor Anesthesia Care | Site: Eye | Laterality: Left

## 2013-05-29 ENCOUNTER — Emergency Department (HOSPITAL_COMMUNITY): Payer: Medicare PPO

## 2013-05-29 ENCOUNTER — Emergency Department (HOSPITAL_COMMUNITY)
Admission: EM | Admit: 2013-05-29 | Discharge: 2013-05-30 | Disposition: A | Discharge status: Home / Self Care | Payer: Medicare PPO | Attending: Emergency Medicine | Admitting: Emergency Medicine

## 2013-05-29 ENCOUNTER — Encounter (HOSPITAL_COMMUNITY): Payer: Self-pay | Admitting: Emergency Medicine

## 2013-05-29 DIAGNOSIS — F29 Unspecified psychosis not due to a substance or known physiological condition: Secondary | ICD-10-CM | POA: Insufficient documentation

## 2013-05-29 DIAGNOSIS — Z79899 Other long term (current) drug therapy: Secondary | ICD-10-CM | POA: Insufficient documentation

## 2013-05-29 DIAGNOSIS — F22 Delusional disorders: Secondary | ICD-10-CM | POA: Diagnosis not present

## 2013-05-29 DIAGNOSIS — T7421XA Adult sexual abuse, confirmed, initial encounter: Secondary | ICD-10-CM | POA: Insufficient documentation

## 2013-05-29 DIAGNOSIS — I10 Essential (primary) hypertension: Secondary | ICD-10-CM | POA: Diagnosis not present

## 2013-05-29 DIAGNOSIS — F411 Generalized anxiety disorder: Secondary | ICD-10-CM | POA: Insufficient documentation

## 2013-05-29 DIAGNOSIS — N898 Other specified noninflammatory disorders of vagina: Secondary | ICD-10-CM | POA: Insufficient documentation

## 2013-05-29 DIAGNOSIS — K6289 Other specified diseases of anus and rectum: Secondary | ICD-10-CM | POA: Diagnosis not present

## 2013-05-29 DIAGNOSIS — Z880 Allergy status to penicillin: Secondary | ICD-10-CM | POA: Diagnosis not present

## 2013-05-29 DIAGNOSIS — R1084 Generalized abdominal pain: Secondary | ICD-10-CM | POA: Diagnosis not present

## 2013-05-29 DIAGNOSIS — N949 Unspecified condition associated with female genital organs and menstrual cycle: Secondary | ICD-10-CM | POA: Diagnosis present

## 2013-05-29 LAB — RAPID URINE DRUG SCREEN, HOSP PERFORMED
Amphetamines: NOT DETECTED
Barbiturates: NOT DETECTED
Benzodiazepines: POSITIVE — AB
COCAINE: NOT DETECTED
OPIATES: NOT DETECTED
Tetrahydrocannabinol: NOT DETECTED

## 2013-05-29 LAB — CBC WITH DIFFERENTIAL/PLATELET
BASOS ABS: 0 10*3/uL (ref 0.0–0.1)
BASOS PCT: 0 % (ref 0–1)
EOS ABS: 0.3 10*3/uL (ref 0.0–0.7)
EOS PCT: 4 % (ref 0–5)
HCT: 45.3 % (ref 36.0–46.0)
Hemoglobin: 15.7 g/dL — ABNORMAL HIGH (ref 12.0–15.0)
LYMPHS ABS: 2 10*3/uL (ref 0.7–4.0)
Lymphocytes Relative: 21 % (ref 12–46)
MCH: 31.1 pg (ref 26.0–34.0)
MCHC: 34.7 g/dL (ref 30.0–36.0)
MCV: 89.7 fL (ref 78.0–100.0)
Monocytes Absolute: 0.6 10*3/uL (ref 0.1–1.0)
Monocytes Relative: 6 % (ref 3–12)
Neutro Abs: 6.6 10*3/uL (ref 1.7–7.7)
Neutrophils Relative %: 69 % (ref 43–77)
PLATELETS: 183 10*3/uL (ref 150–400)
RBC: 5.05 MIL/uL (ref 3.87–5.11)
RDW: 13 % (ref 11.5–15.5)
WBC: 9.5 10*3/uL (ref 4.0–10.5)

## 2013-05-29 LAB — URINALYSIS, ROUTINE W REFLEX MICROSCOPIC
Bilirubin Urine: NEGATIVE
Glucose, UA: NEGATIVE mg/dL
HGB URINE DIPSTICK: NEGATIVE
Ketones, ur: NEGATIVE mg/dL
NITRITE: NEGATIVE
Protein, ur: NEGATIVE mg/dL
SPECIFIC GRAVITY, URINE: 1.011 (ref 1.005–1.030)
UROBILINOGEN UA: 0.2 mg/dL (ref 0.0–1.0)
pH: 7 (ref 5.0–8.0)

## 2013-05-29 LAB — COMPREHENSIVE METABOLIC PANEL
ALBUMIN: 4.2 g/dL (ref 3.5–5.2)
ALT: 26 U/L (ref 0–35)
AST: 25 U/L (ref 0–37)
Alkaline Phosphatase: 98 U/L (ref 39–117)
BUN: 12 mg/dL (ref 6–23)
CALCIUM: 9.8 mg/dL (ref 8.4–10.5)
CO2: 27 mEq/L (ref 19–32)
Chloride: 101 mEq/L (ref 96–112)
Creatinine, Ser: 0.61 mg/dL (ref 0.50–1.10)
GFR calc non Af Amer: >90 mL/min (ref >90)
GLUCOSE: 111 mg/dL — AB (ref 70–99)
Potassium: 3.7 mEq/L (ref 3.7–5.3)
SODIUM: 142 meq/L (ref 137–147)
TOTAL PROTEIN: 7.9 g/dL (ref 6.0–8.3)
Total Bilirubin: 0.4 mg/dL (ref 0.3–1.2)

## 2013-05-29 LAB — URINE MICROSCOPIC-ADD ON

## 2013-05-29 LAB — LIPASE, BLOOD: LIPASE: 58 U/L (ref 11–59)

## 2013-05-29 LAB — PRO B NATRIURETIC PEPTIDE: Pro B Natriuretic peptide (BNP): 19.5 pg/mL (ref 0–125)

## 2013-05-29 LAB — TROPONIN I

## 2013-05-29 LAB — CK: Total CK: 45 U/L (ref 7–177)

## 2013-05-29 MED ORDER — SODIUM CHLORIDE 0.9 % IV BOLUS (SEPSIS)
1000.0000 mL | Freq: Once | INTRAVENOUS | Status: AC
  Administered 2013-05-29: 1000 mL via INTRAVENOUS

## 2013-05-29 MED ORDER — IOHEXOL 300 MG/ML  SOLN
100.0000 mL | Freq: Once | INTRAMUSCULAR | Status: AC | PRN
  Administered 2013-05-29: 100 mL via INTRAVENOUS

## 2013-05-29 MED ORDER — IOHEXOL 300 MG/ML  SOLN
50.0000 mL | Freq: Once | INTRAMUSCULAR | Status: AC | PRN
  Administered 2013-05-29: 50 mL via ORAL
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
## 2013-05-29 NOTE — ED Notes (Signed)
Bed: WA02 Expected date:  Expected time:  Means of arrival:  Comments: HOLD FOR TRIAGE 1
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
## 2013-05-30 LAB — GC/CHLAMYDIA PROBE AMP
CT Probe RNA: NEGATIVE
GC Probe RNA: NEGATIVE

## 2013-05-30 LAB — WET PREP, GENITAL
TRICH WET PREP: NONE SEEN
YEAST WET PREP: NONE SEEN

## 2013-05-30 MED ORDER — CIPROFLOXACIN HCL 250 MG PO TABS: 250.0000 mg | ORAL_TABLET | Freq: Two times a day (BID) | ORAL | Status: DC

## 2013-05-30 MED ORDER — ACETAMINOPHEN 325 MG PO TABS
650.0000 mg | ORAL_TABLET | Freq: Once | ORAL | Status: AC
  Administered 2013-05-30: 650 mg via ORAL
  Filled 2013-05-30: qty 2

## 2013-05-30 NOTE — ED Provider Notes (Signed)
Medical screening examination/treatment/procedure(s) were performed by non-physician practitioner and as supervising physician I was immediately available for consultation/collaboration.   EKG Interpretation   Date/Time:  Monday May 29 2013 21:18:02 EDT Ventricular Rate:  93 PR Interval:  152 QRS Duration: 95 QT Interval:  377 QTC Calculation: 469 R Axis:   -12 Text Interpretation:  Sinus rhythm Probable LVH with secondary repol abnrm  Inferior infarct, old        Gwyneth SproutWhitney Bethene Hankinson, MD 05/30/13 2357

## 2013-05-30 NOTE — Discharge Instructions (Signed)
Hallucinations and Delusions  You seem to be having hallucinations and/or delusions. You may be hearing voices that no one else can hear. This can seem very real to you. You may be having thoughts and fears that do not make sense to others. This condition can be due to mental disease like schizophrenia. It may be caused by a medical condition, such as an infection or electrolyte disturbance. These symptoms are also seen in drug abusers, especially those who use crack cocaine and amphetamines. Drugs like PCP, LSD, MDMA, peyote, and psilocybin can also cause frightening hallucinations and loss of control.  If your symptoms are due to drug abuse, your mental state should improve as the drug(s) leave your system. Someone you trust should be with you until you are better to protect you and calm your fears. Often tranquilizers are very helpful at controlling hallucinations, anxiety, and destructive behavior. Getting a proper diet and enough sleep is important to recovery. If your symptoms are not due to drugs, or do not improve over several days after stopping drug use, you need further medical or mental health care.  SEEK IMMEDIATE MEDICAL CARE IF:   · Your symptoms get worse, especially if you think your life is in danger  · You have violent or destructive thoughts.  Recovery is possible, but you have to get proper treatment and avoid drugs that are known to cause you trouble.  Document Released: 03/12/2004 Document Revised: 04/27/2011 Document Reviewed: 02/02/2005  ExitCare® Patient Information ©2014 [employer].
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
## 2013-05-30 NOTE — ED Notes (Signed)
Pt wanting to leave, pt dressed in room, stating she can stay the night at her sisters house, [employer] has talked w/ pt about this, PA made aware, pt is not to leave at this time.

## 2013-05-30 NOTE — ED Provider Notes (Signed)
Discussed with TTS.  Patient is presently psychotic without any homicidal or suicidal ideation. Per TTS, patient was given resources. She does not meet inpatient criteria and I do not feel she is a hard to herself or others at this time.  Shon Batonourtney F Horton, MD 05/30/13 [phone]

## 2014-10-30 IMAGING — CT CT ABD-PELV W/ CM
1 of 3 series · 14 of 32 positions shown, 19 images · IV contrast (omnipaque)
Comparison: 07/03/2012.

CLINICAL DATA: Lower abdominal pain and burning.

EXAM:
CT ABDOMEN AND PELVIS WITH CONTRAST
TECHNIQUE: Multidetector CT imaging of the abdomen and pelvis was performed
using the standard protocol following bolus administration of
intravenous contrast.
CONTRAST:  100 cc Omnipaque 300

[Series 2: abd/pel with · axial · 0.82mm/px · z∈[+1120,+1520]mm · 14 of 90 slices shown, 19 images]
[im 5/90  soft-tissue]
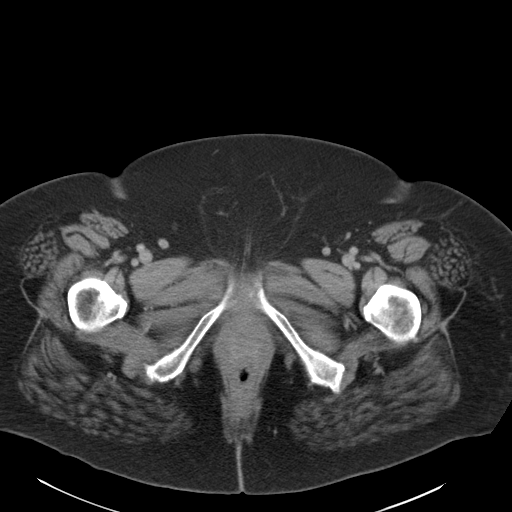
[im 5/90  bone]
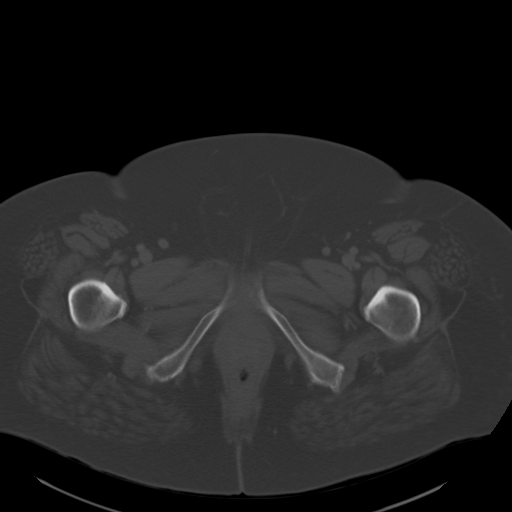
[im 15/90  soft-tissue]
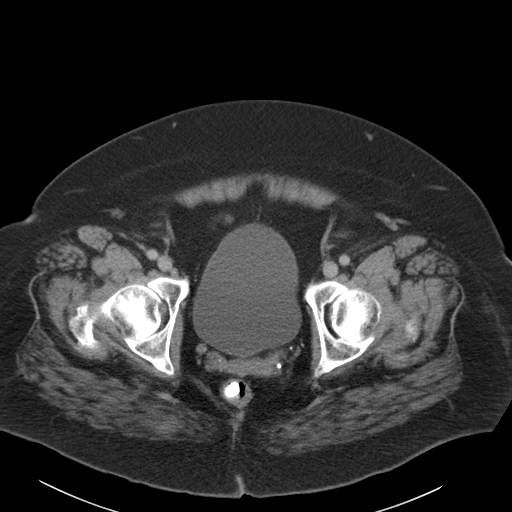
[im 19/90  soft-tissue]
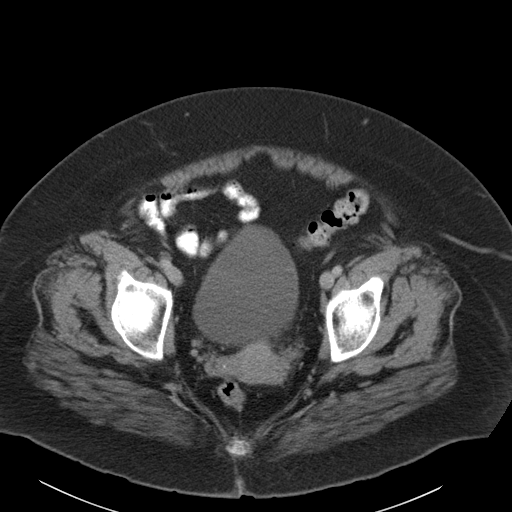
[im 24/90  soft-tissue]
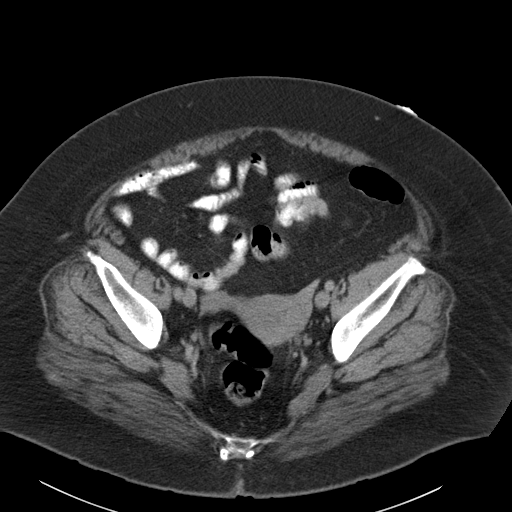
[im 33/90  soft-tissue]
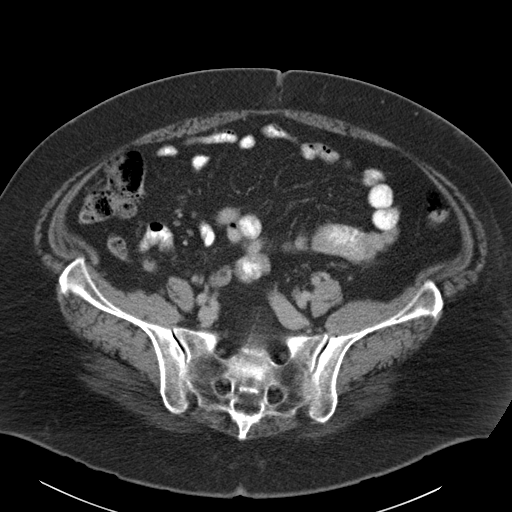
[im 38/90  soft-tissue]
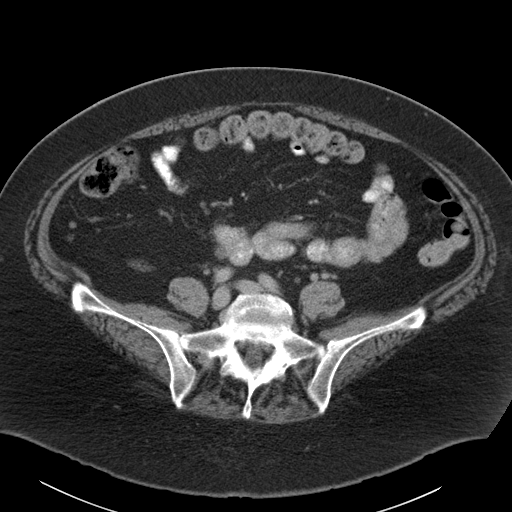
[im 47/90  soft-tissue]
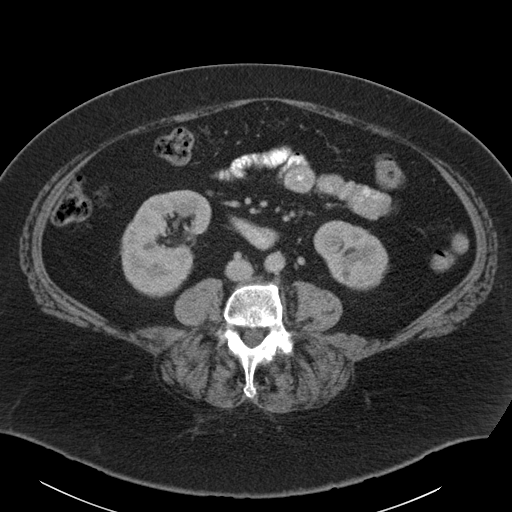
[im 52/90  soft-tissue]
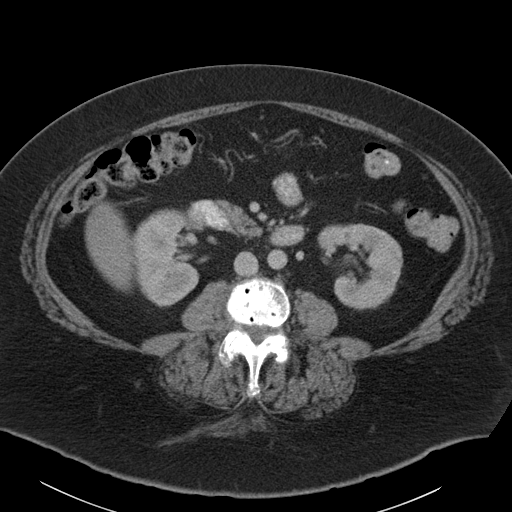
[im 57/90  soft-tissue]
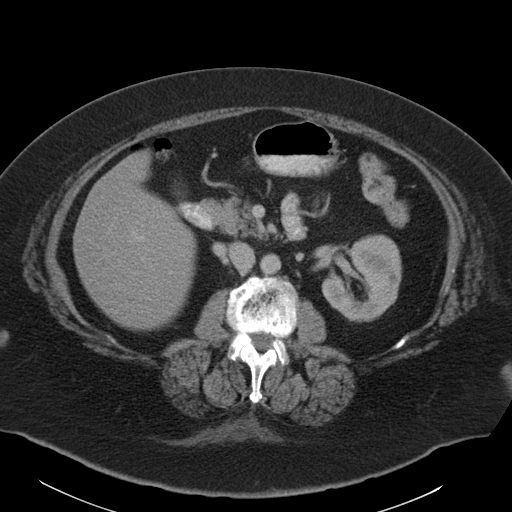
[im 57/90  bone]
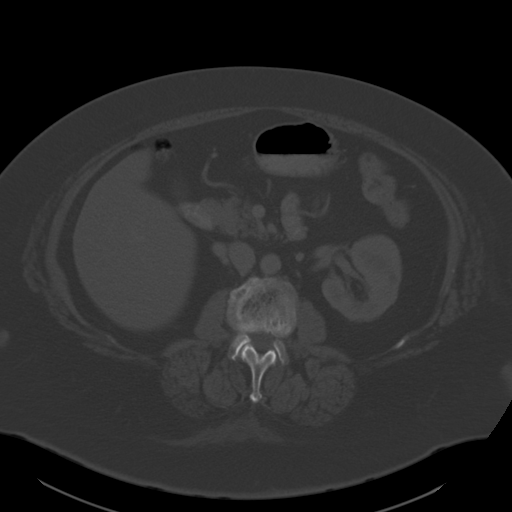
[im 66/90  soft-tissue]
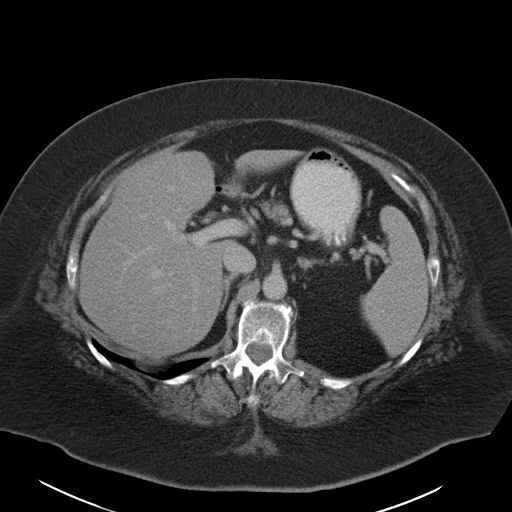
[im 71/90  soft-tissue]
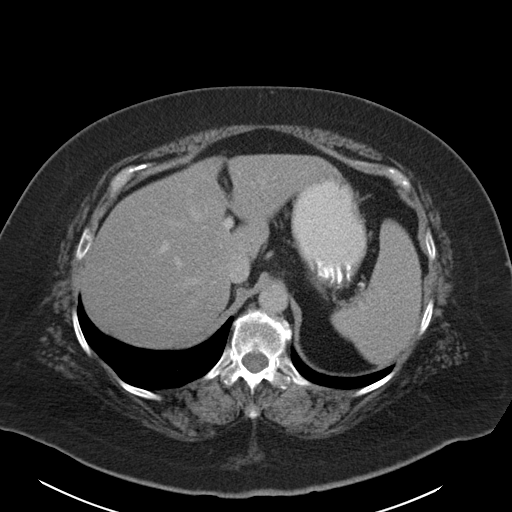
[im 71/90  lung]
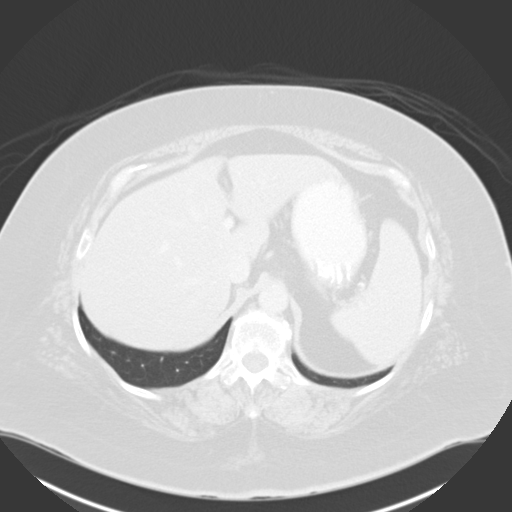
[im 75/90  soft-tissue]
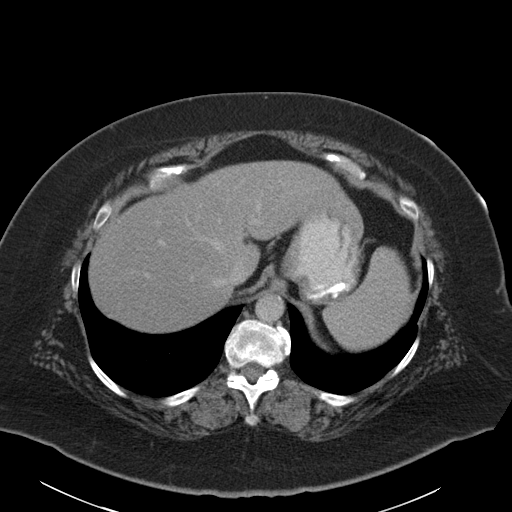
[im 75/90  lung]
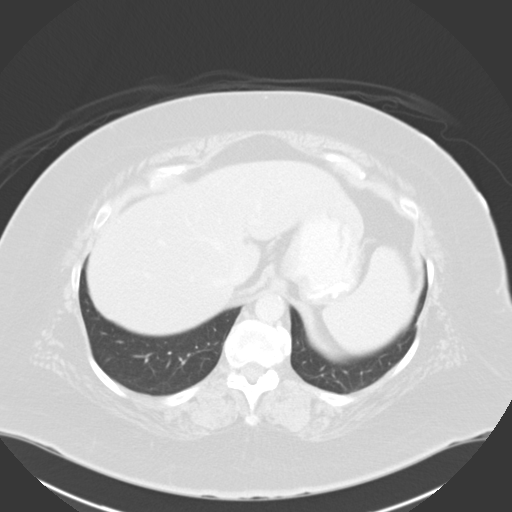
[im 80/90  lung]
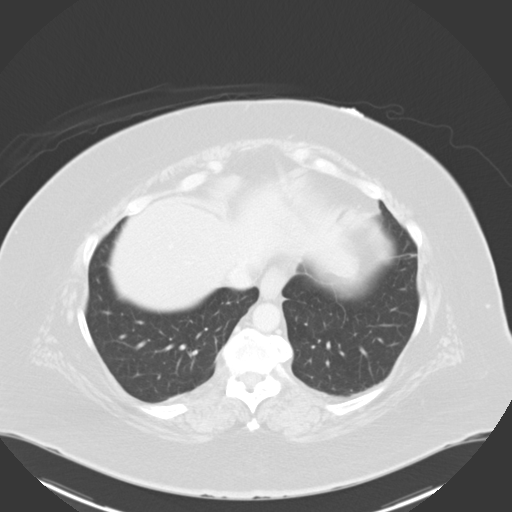
[im 85/90  soft-tissue]
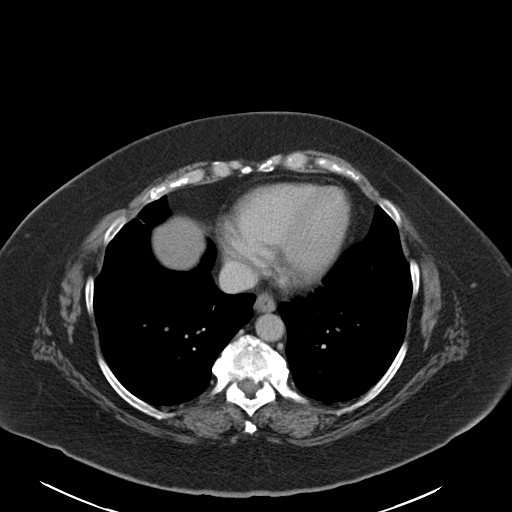
[im 85/90  lung]
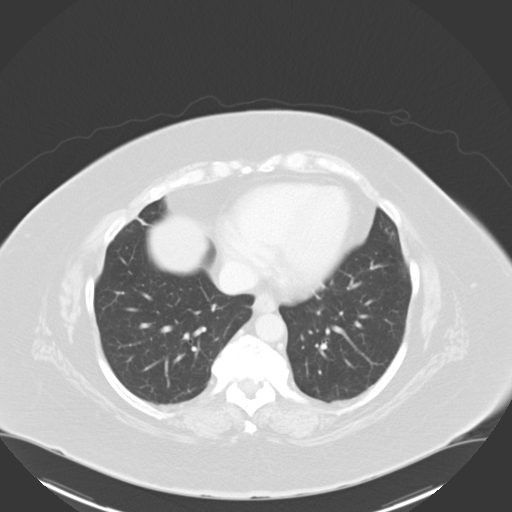

[14 of 32 positions shown; findings below may reference images not displayed]

FINDINGS: Lung bases are clear.  No pleural or pericardial fluid.

The liver has a normal appearance without focal lesions or biliary
ductal dilatation. No calcified gallstones. The spleen is normal.
The pancreas is normal. The adrenal glands are normal. The kidneys
normal except for a 1 cm cyst laterally in the left kidney. The
aorta and IVC are unremarkable. No retroperitoneal mass or
adenopathy. No free intraperitoneal fluid or air. No bowel
pathology. The appendix is normal. There is a hyperdense entity in
the rectum, possibly a suppository. Uterus and adnexal regions
appear normal. Bladder appears normal. Extensive chronic
degenerative changes affect the spine.
IMPRESSION: No acute finding.

## 2018-07-23 LAB — HEMOGLOBIN A1C: Hemoglobin A1C, External: 5 % (ref 4.8–5.6)

## 2021-01-02 LAB — HEMOGLOBIN A1C: Hemoglobin A1C, External: 4.9 % (ref 4.8–5.6)

## 2021-08-26 LAB — HEMOGLOBIN A1C: Hemoglobin A1C, External: 5.2 % (ref 4.8–5.6)

## 2021-08-26 NOTE — Telephone Encounter (Signed)
Formatting of this note might be different from the original.    Caller: Remer Macho    Relationship: Self    Best call back number: [phone]    What is the best time to reach you: ANYTIME    Who are you requesting to speak with (clinical staff, provider,  specific staff member): CLINICAL    What was the call regarding: WANTING TO GET CLARIFICATION REGARDING BLOOD PRESSURE MEDICATIONS SHE'S SUPPOSED TO BE TAKING, STATES SHE'S CURRENTLY TAKING TWO.  Electronically signed by Georgiann Cocker, RegSched Rep at 08/26/2021  1:01 PM EDT

## 2021-08-26 NOTE — Telephone Encounter (Signed)
Formatting of this note might be different from the original.  Called patient back.   Electronically signed by Blima Rich, RN at 08/26/2021  1:06 PM EDT

## 2021-12-29 NOTE — Progress Notes (Signed)
Formatting of this note is different from the original.  Chief Complaint  Hypertension, Dizziness, and stomach issues    Subjective      Margaret Hopkins presents to [place]  Hypertension    Dizziness    Margaret Hopkins is a 71 y.o. female who presents for a routine visit at this time.  Patient states that she is coming in secondary to multiple medical complaints she would like to go ahead and get a wheelchair and chair as well as a knee brace.  Patient has significant arthritis issues with multiple joints as well as balance issues and states that these help her be able to ambulate and get around per normal.    Patient also has a longstanding history of anxiety.  She states she does very well with her current dose of Valium.  She does help help significantly with her anxiety and allows her to do more of her activities of daily living.  Patient reports no significant side effects of the medicine and no overuse or overdose symptoms.  Patient is also currently on BuSpar to help with her anxiety.  She states that she initially started this more so for her vertigo and it well up significantly with that at this time as well.    Blood pressure was significantly elevated blood pressure was 175/84.  We will go ahead and add some Norvasc to help with her blood pressure at this time last for visits have been elevated as well but not as high as it was today.  Patient states that her COPD remains well controlled on her current dose of medication.    Patient states that she also would like to go ahead and get her flu and pneumococcal vaccine at this time.  We discussed the mammogram and DEXA however patient needs to put these off until she can figure out what she is going to do for right.    Objective   Vital Signs:  BP 175/84 (BP Location: Left arm, Patient Position: Sitting, Cuff Size: Adult)   Pulse 72   Temp 98.4 F (36.9 C) (Infrared)   Ht 165.1 cm (65")   Wt 111 kg (244 lb)   SpO2 93%    BMI 40.60 kg/m   Estimated body mass index is 40.6 kg/m as calculated from the following:    Height as of this encounter: 165.1 cm (65").    Weight as of this encounter: 111 kg (244 lb).      Past Medical History:   Diagnosis Date    Arthritis     all over    Hypertension      Past Surgical History:   Procedure Laterality Date    FRACTURE SURGERY      Left ankle has medal rod    INNER EAR SURGERY Bilateral     R bones removed     Social History     Tobacco Use    Smoking status: Never    Smokeless tobacco: Never   Vaping Use    Vaping Use: Never used   Substance Use Topics    Alcohol use: No    Drug use: No     Family History   Problem Relation Age of Onset    Arthritis Mother     Osteoporosis Mother     Arthritis Father     Hyperlipidemia Father     Cancer Sister     Diabetes Sister     Obesity Daughter  Hyperlipidemia Son     Cancer Maternal Aunt     Diabetes Maternal Aunt     Cancer Paternal Aunt      Allergies as of 12/29/2021 - Reviewed 12/29/2021   Allergen Reaction Noted    Penicillins Swelling 09/29/2017     Current Outpatient Medications:     bumetanide (BUMEX) 0.5 MG tablet, TAKE ONE TABLET BY MOUTH TWICE A DAY, Disp: 60 tablet, Rfl: 0    busPIRone (BUSPAR) 5 MG tablet, TAKE ONE TABLET BY MOUTH THREE TIMES DAILY, Disp: 100 tablet, Rfl: 5    diazePAM (VALIUM) 2 MG tablet, Take 1 tablet by mouth Every 8 (Eight) Hours As Needed for Anxiety., Disp: 90 tablet, Rfl: 2    Diclofenac Sodium (VOLTAREN) 1 % gel gel, APPLY TOPICALLY TO THE APPROPRIATE AREA AS DIRECTED 2 TIMES A DAY., Disp: 300 g, Rfl: 1    erythromycin (ROMYCIN) 5 MG/GM ophthalmic ointment, Administer  to both eyes Every Night., Disp: 1 g, Rfl: 0    fluticasone (FLONASE) 50 MCG/ACT nasal spray, 2 sprays into the nostril(s) as directed by provider Daily., Disp: 16 g, Rfl: 11    hyoscyamine (LEVSIN) 0.125 MG SL tablet, TAKE ONE TABLET BY MOUTH EVERY SIX HOURS AS NEEDED FOR ABDOMINAL SPASMS., Disp: 120 tablet, Rfl: 2    loratadine (CLARITIN) 10  MG tablet, TAKE 1 TABLET BY MOUTH DAILY., Disp: 90 tablet, Rfl: 3    losartan (Cozaar) 100 MG tablet, Take 1 tablet by mouth Daily., Disp: 90 tablet, Rfl: 3    meclizine (ANTIVERT) 25 MG tablet, Take 1 tablet by mouth 3 (Three) Times a Day As Needed for Dizziness., Disp: 270 tablet, Rfl: 2    metoprolol succinate XL (TOPROL-XL) 50 MG 24 hr tablet, TAKE 1 TABLET BY MOUTH DAILY., Disp: 90 tablet, Rfl: 1    Misc. Devices ([address] [place]) misc, 1 each Daily., Disp: 1 each, Rfl: 0    Multiple Vitamins-Minerals (OCUVITE ADULT FORMULA PO), Take  by mouth Daily., Disp: , Rfl:     mupirocin (Bactroban) 2 % ointment, Apply  topically to the appropriate area as directed 3 (Three) Times a Day., Disp: 30 g, Rfl: 0    NON FORMULARY, Take 100 mg by mouth Daily. Curamine, Disp: , Rfl:     NON FORMULARY, Take  by mouth Daily. Curamed, Disp: , Rfl:     ondansetron ODT (ZOFRAN-ODT) 4 MG disintegrating tablet, DISSOLVE ONE TABLET ON TOP OF THE TONGUE EVERY 8 HOURS AS NEEDED FOR NAUSEA & VOMITING, Disp: 30 tablet, Rfl: 2    pantoprazole (PROTONIX) 40 MG EC tablet, Take 1 tablet by mouth 2 (Two) Times a Day., Disp: 180 tablet, Rfl: 1    potassium chloride (MICRO-K) 10 MEQ CR capsule, Take 1 capsule by mouth 2 (Two) Times a Day., Disp: 60 capsule, Rfl: 2    promethazine (PHENERGAN) 25 MG tablet, TAKE ONE TABLET BY MOUTH EVERY 6 HOURS AS NEEDED FOR NAUSEA & VOMITING, Disp: 60 tablet, Rfl: 1    Promethegan 25 MG suppository, , Disp: , Rfl:     saccharomyces boulardii (Florastor) 250 MG capsule, Take 1 capsule by mouth 2 (Two) Times a Day., Disp: 60 capsule, Rfl: 11    Scopolamine 1 MG/3DAYS patch, PLACE 1 PATCH ON THE SKIN AS DIRECTED BY PROVIDER EVERY 72 (SEVENTY-TWO) HOURS., Disp: 10 patch, Rfl: 1    albuterol sulfate HFA 108 (90 Base) MCG/ACT inhaler, Inhale 2 puffs Every 4 (Four) Hours As Needed for Wheezing., Disp: 18 g, Rfl: 11  amLODIPine (NORVASC) 5 MG tablet, Take 1 tablet by mouth Daily., Disp: 30 tablet, Rfl: 11    Physical  Exam  Vitals and nursing note reviewed.   Constitutional:       General: She is not in acute distress.     Appearance: Normal appearance.   HENT:      Head: Normocephalic.   Eyes:      Extraocular Movements: Extraocular movements intact.      Pupils: Pupils are equal, round, and reactive to light.   Cardiovascular:      Rate and Rhythm: Normal rate and regular rhythm.      Heart sounds: Normal heart sounds. No murmur heard.  Pulmonary:      Effort: Pulmonary effort is normal. No respiratory distress.      Breath sounds: Normal breath sounds. No rhonchi or rales.   Abdominal:      General: Abdomen is flat. Bowel sounds are normal.      Palpations: Abdomen is soft.   Neurological:      General: No focal deficit present.      Mental Status: She is alert.       Result Review :        Assessment and Plan   Diagnoses and all orders for this visit:    1. Primary osteoarthritis, unspecified site (Primary)  -     Wheelchair  -     [phone] Knee Orthosis, Elastic With Joints (Hinged Knee Brace)    2. Balance problem  -     Wheelchair  -     [id number] Knee Orthosis, Elastic With Joints (Hinged Knee Brace)    3. Primary hypertension  -     losartan (Cozaar) 100 MG tablet; Take 1 tablet by mouth Daily.  Dispense: 90 tablet; Refill: 3  -     amLODIPine (NORVASC) 5 MG tablet; Take 1 tablet by mouth Daily.  Dispense: 30 tablet; Refill: 11    4. Chronic vertigo  -     diazePAM (VALIUM) 2 MG tablet; Take 1 tablet by mouth Every 8 (Eight) Hours As Needed for Anxiety.  Dispense: 90 tablet; Refill: 2    5. Long-term use of high-risk medication  -     Pain Management Profile (13 Drugs) Urine - Urine, Clean Catch    6. Mild intermittent asthma without complication  -     albuterol sulfate HFA 108 (90 Base) MCG/ACT inhaler; Inhale 2 puffs Every 4 (Four) Hours As Needed for Wheezing.  Dispense: 18 g; Refill: 11    7. Need for pneumococcal vaccination  -     Pneumococcal Conjugate Vaccine 20-Valent All    8. Need for influenza vaccination  -      Fluzone High-Dose 65+yrs    Other orders  -     fluticasone (FLONASE) 50 MCG/ACT nasal spray; 2 sprays into the nostril(s) as directed by provider Daily.  Dispense: 16 g; Refill: 11    As part of this patient's treatment plan, I am prescribing controlled substances. The patient has been made aware of appropriate use of such medications, including potential risk of somnolence, limited ability to drive and /or work safely, and potential for dependence or overdose. It has also been made clear that these medications are for use by this patient only, without concomitant use of alcohol or other substances unless prescribed.  Patient has been advised that PRN or as needed pain medication allows the patient to skip doses if not needed, but that the  medication is not to be taken more often or at higher doses than prescribed.    Patient has completed prescribing agreement detailing terms of continued prescribing of controlled substances, including monitoring KASPER reports, urine drug screening, and pill counts if necessary. The patient is aware that inappropriate use will result in cessation of prescribing such medications, and possible termination from this practice.    History and physical exam exhibit continued safe and appropriate use of controlled substances.    1.  Controlled substance abuse agreement discussed and copy in record: YES  2.  Discussed:   Risk of addiction: YES   Specific risk of medications:YES   Side effects of medications: YES   Reasonable expectations of the medication: YES  3.  Treatment Objectives:   Discussed management of constipation: YES   Discussed management of other side effects: YES  4.  eKASPER:     KASPER report has been reviewed by: Marquette Old, MD on 12/29/21 in the PDMD in the electronic medical record.  @KASPERREVIEWED @   Results/Date of last KASPER:  appropriate  5.  Results/Date of last drug screen:  appropriate  6.  Follow up plan:    Follow Up in One Months Time               Continue on current medications as directed    EMR Dictation/Transcription disclaimer:   Some of this note may be an electronic transcription/translation of spoken language to printed text. The electronic translation of spoken language may permit erroneous, or at times, nonsensical words or phrases to be inadvertently transcribed; Although I have reviewed the note for such errors, some may still exist.         Follow Up   No follow-ups on file.  Patient was given instructions and counseling regarding her condition or for health maintenance advice. Please see specific information pulled into the AVS if appropriate.       Electronically signed by , MD at 12/29/2021 12:57 PM EST

## 2022-01-12 NOTE — Telephone Encounter (Signed)
Formatting of this note is different from the original.  Rx Refill Note  Requested Prescriptions     Pending Prescriptions Disp Refills    ondansetron ODT (ZOFRAN-ODT) 4 MG disintegrating tablet [Pharmacy Med Name: ONDANSETRON 4 MG ODT TAB 4 Tablet] 30 tablet 2     Sig: DISSOLVE ONE TABLET ON TOP OF THE TONGUE EVERY 8 HOURS AS NEEDED FOR NAUSEA & VOMITING     Last office visit with prescribing clinician: 12/29/2021   Last telemedicine visit with prescribing clinician: Visit date not found   Next office visit with prescribing clinician: 04/01/2022           Would you like a call back once the refill request has been completed: []  Yes []  No    If the office needs to give you a call back, can they leave a voicemail: []  Yes []  No    Edrick Kins, RN  01/12/22, 07:11 CST    Electronically signed by Edrick Kins, RN at 01/12/2022  8:11 AM EST

## 2022-01-15 ENCOUNTER — Encounter

## 2022-01-15 ENCOUNTER — Ambulatory Visit: Admit: 2022-01-15 | Discharge: 2022-01-15 | Payer: MEDICARE | Attending: Family Medicine | Primary: Family Medicine

## 2022-01-15 DIAGNOSIS — Z0000 Encounter for general adult medical examination without abnormal findings: Secondary | ICD-10-CM

## 2022-01-15 DIAGNOSIS — Z7689 Persons encountering health services in other specified circumstances: Secondary | ICD-10-CM

## 2022-01-15 LAB — CBC
Hematocrit: 37.3 % (ref 37.0–47.0)
Hemoglobin: 12.4 g/dL (ref 12.0–16.0)
MCH: 31 pg (ref 27.0–31.0)
MCHC: 33.2 g/dL (ref 33.0–37.0)
MCV: 93.3 fL (ref 81.0–99.0)
MPV: 10 fL (ref 9.4–12.3)
Platelets: 176 10*3/uL (ref 130–400)
RBC: 4 M/uL — ABNORMAL LOW (ref 4.20–5.40)
RDW: 12.5 % (ref 11.5–14.5)
WBC: 7.8 10*3/uL (ref 4.8–10.8)

## 2022-01-15 LAB — COMPREHENSIVE METABOLIC PANEL
ALT: 14 U/L (ref 5–33)
AST: 18 U/L (ref 5–32)
Albumin: 4.3 g/dL (ref 3.5–5.2)
Alkaline Phosphatase: 67 U/L (ref 35–104)
Anion Gap: 11 mmol/L (ref 7–19)
BUN: 36 mg/dL — ABNORMAL HIGH (ref 8–23)
CO2: 30 mmol/L — ABNORMAL HIGH (ref 22–29)
Calcium: 9.6 mg/dL (ref 8.8–10.2)
Chloride: 102 mmol/L (ref 98–111)
Creatinine: 1.4 mg/dL — ABNORMAL HIGH (ref 0.5–0.9)
Est, Glom Filt Rate: 40 — AB (ref >60)
Glucose: 123 mg/dL — ABNORMAL HIGH (ref 74–109)
Potassium: 4.3 mmol/L (ref 3.5–5.0)
Sodium: 143 mmol/L (ref 136–145)
Total Bilirubin: <0.2 mg/dL (ref 0.2–1.2)
Total Protein: 7.3 g/dL (ref 6.6–8.7)

## 2022-01-15 LAB — MICROSCOPIC URINALYSIS
Bacteria, UA: NEGATIVE /HPF — AB
Crystals, UA: NEGATIVE /HPF — AB
Epithelial Cells, UA: 3 /HPF (ref 0–5)
Hyaline Casts, UA: 9 /HPF — ABNORMAL HIGH (ref 0–8)
RBC, UA: 1 /HPF (ref 0–4)
WBC, UA: 11 /HPF — ABNORMAL HIGH (ref 0–5)

## 2022-01-15 LAB — URINALYSIS WITH REFLEX TO CULTURE
Bilirubin Urine: NEGATIVE
Blood, Urine: NEGATIVE
Glucose, Ur: NEGATIVE mg/dL
Ketones, Urine: NEGATIVE mg/dL
Nitrite, Urine: NEGATIVE
Protein, UA: NEGATIVE mg/dL
Specific Gravity, UA: 1.02 (ref 1.005–1.030)
Urobilinogen, Urine: 0.2 E.U./dL (ref <2.0)
pH, UA: 6 (ref 5.0–8.0)

## 2022-01-15 LAB — LIPID PANEL
Cholesterol, Total: 175 mg/dL (ref 160–199)
HDL: 55 mg/dL — ABNORMAL LOW (ref 65–121)
LDL Calculated: 95 mg/dL (ref <100)
Triglycerides: 127 mg/dL (ref 0–149)

## 2022-01-15 LAB — HEPATITIS C ANTIBODY: Hep C Ab Interp: NONREACTIVE

## 2022-01-15 LAB — TSH: TSH: 0.196 u[IU]/mL — ABNORMAL LOW (ref 0.270–4.200)

## 2022-01-15 MED ORDER — DICLOFENAC SODIUM 1 % EX GEL
1 | Freq: Two times a day (BID) | CUTANEOUS | 5 refills | Status: DC
Start: 2022-01-15 — End: 2023-08-31

## 2022-01-15 NOTE — Patient Instructions (Signed)
Preventing Falls: Care Instructions    Talk to your doctor about the medicines you take. Ask if any of them increase the risk of falls and whether they can be changed or stopped.   Try to exercise regularly. It can help improve your strength and balance. This can help lower your risk of falling.     Practice fall safety and prevention.    Wear low-heeled shoes that fit well and give your feet good support. Talk to your doctor if you have foot problems that make this hard.  Carry a cellphone or wear a medical alert device that you can use to call for help.  Use stepladders instead of chairs to reach high objects. Don't climb if you're at risk for falls. Ask for help, if needed.  Wear the correct eyeglasses, if you need them.    Make your home safer.    Remove rugs, cords, clutter, and furniture from walkways.  Keep your house well lit. Use night-lights in hallways and bathrooms.  Install and use sturdy handrails on stairways.  Wear nonskid footwear, even inside. Don't walk barefoot or in socks without shoes.    Be safe outside.    Use handrails, curb cuts, and ramps whenever possible.  Keep your hands free by using a shoulder bag or backpack.  Try to walk in well-lit areas. Watch out for uneven ground, changes in pavement, and debris.  Be careful in the winter. Walk on the grass or gravel when sidewalks are slippery. Use de-icer on steps and walkways. Add non-slip devices to shoes.    Put grab bars and nonskid mats in your shower or tub and near the toilet. Try to use a shower chair or bath bench when bathing.   Get into a tub or shower by putting in your weaker leg first. Get out with your strong side first. Have a phone or medical alert device in the bathroom with you.   Where can you learn more?  Go to https://www.bennett.info/ and enter G117 to learn more about "Preventing Falls: Care Instructions."  Current as of: July 18, 2023Content Version: 13.8   2006-2023 Healthwise,  Incorporated.   Care instructions adapted under license by [facility]. If you have questions about a medical condition or this instruction, always ask your healthcare professional. [place] any warranty or liability for your use of this information.           Hearing Loss: Care Instructions  Overview     Hearing loss is a sudden or slow decrease in how well you hear. It can range from slight to profound. Permanent hearing loss can occur with aging. It also can happen when you are exposed long-term to loud noise. Examples include listening to loud music, riding motorcycles, or being around other loud machines.  Hearing loss can affect your work and home life. It can make you feel lonely or depressed. You may feel that you have lost your independence. But hearing aids and other devices can help you hear better and feel connected to others.  Follow-up care is a key part of your treatment and safety. Be sure to make and go to all appointments, and call your doctor if you are having problems. It's also a good idea to know your test results and keep a list of the medicines you take.  How can you care for yourself at home?  Avoid loud noises whenever possible. This helps keep your hearing from getting worse.  Always wear hearing protection  around loud noises.  Wear a hearing aid as directed.  A professional can help you pick a hearing aid that will work best for you.  You can also get hearing aids over the counter for mild to moderate hearing loss.  Have hearing tests as your doctor suggests. They can show whether your hearing has changed. Your hearing aid may need to be adjusted.  Use other devices as needed. These may include:  Telephone amplifiers and hearing aids that can connect to a television, stereo, radio, or microphone.  Devices that use lights or vibrations. These alert you to the doorbell, a ringing telephone, or a baby monitor.  Television closed-captioning. This shows the words at  the bottom of the screen. Most new TVs can do this.  TTY (text telephone). This lets you type messages back and forth on the telephone instead of talking or listening. These devices are also called TDD. When messages are typed on the keyboard, they are sent over the phone line to a receiving TTY. The message is shown on a monitor.  Use text messaging, social media, and email if it is hard for you to communicate by telephone.  Try to learn a listening technique called speechreading. It is not lipreading. You pay attention to people's gestures, expressions, posture, and tone of voice. These clues can help you understand what a person is saying. Face the person you are talking to, and have them face you. Make sure the lighting is good. You need to see the other person's face clearly.  Think about counseling if you need help to adjust to your hearing loss.  When should you call for help?  Watch closely for changes in your health, and be sure to contact your doctor if:   You think your hearing is getting worse.    You have new symptoms, such as dizziness or nausea.   Where can you learn more?  Go to https://www.bennett.info/ and enter R798 to learn more about "Hearing Loss: Care Instructions."  Current as of: February 28, 2023Content Version: 13.8   2006-2023 Healthwise, Incorporated.   Care instructions adapted under license by [facility]. If you have questions about a medical condition or this instruction, always ask your healthcare professional. [place] any warranty or liability for your use of this information.           Learning About Vision Tests  What are vision tests?     The four most common vision tests are visual acuity tests, refraction, visual field tests, and color vision tests.  Visual acuity (sharpness) tests  These tests are used:  To see if you need glasses or contact lenses.  To monitor an eye problem.  To check an eye injury.  Visual acuity  tests are done as part of routine exams. You may also have this test when you get your driver's license or apply for some types of jobs.  Visual field tests  These tests are used:  To check for vision loss in any area of your range of vision.  To screen for certain eye diseases.  To look for nerve damage after a stroke, head injury, or other problem that could reduce blood flow to the brain.  Refraction and color tests  A refraction test is done to find the right prescription for glasses and contact lenses.  A color vision test is done to check for color blindness.  Color vision is often tested as part of a routine exam. You may also  have this test when you apply for a job where recognizing different colors is important, such as truck driving, Research officer, trade union, or the [employer].  How are vision tests done?  Visual acuity test   You cover one eye at a time.  You read aloud from a wall chart across the room.  You read aloud from a small card that you hold in your hand.  Refraction   You look into a special device.  The device puts lenses of different strengths in front of each eye to see how strong your glasses or contact lenses need to be.  Visual field tests   Your doctor may have you look through special machines.  Or your doctor may simply have you stare straight ahead while they move a finger into and out of your field of vision.  Color vision test   You look at pieces of printed test patterns in various colors. You say what number or symbol you see.  Your doctor may have you trace the number or symbol using a pointer.  How do these tests feel?  There is very little chance of having a problem from this test. If dilating drops are used for a vision test, they may make the eyes sting and cause a medicine taste in the mouth.  Follow-up care is a key part of your treatment and safety. Be sure to make and go to all appointments, and call your doctor if you are having problems. It's also a good idea to know your test results  and keep a list of the medicines you take.  Where can you learn more?  Go to https://www.bennett.info/ and enter G551 to learn more about "Learning About Vision Tests."  Current as of: June 6, 2023Content Version: 13.8   2006-2023 Healthwise, Incorporated.   Care instructions adapted under license by [facility]. If you have questions about a medical condition or this instruction, always ask your healthcare professional. [place] any warranty or liability for your use of this information.           Learning About Activities of Daily Living  What are activities of daily living?     Activities of daily living (ADLs) are the basic self-care tasks you do every day. As you age, and if you have health problems, you may find that it's harder to do these things for yourself. That's when you may need some help.  Your doctor uses ADLs to measure how much help you need. Knowing what you can and can't do for yourself is an important first step to getting help. And when you have the help you need, you can stay as independent as possible.  Your doctor will want to know if you are able to do tasks such as:  Take a bath or shower without help.  Go to the bathroom by yourself.  Dress and undress without help.  Shave, comb your hair, and brush teeth on your own.  Get in and out of bed or a chair without help.  Feed yourself without help.  If you are having trouble doing basic self-care tasks, talk with your doctor. You may want to bring a caregiver or family member who can help the doctor understand your needs and abilities.  How will a doctor assess your ADLs?  Asking about ADLs is part of a routine health checkup your doctor will likely do as you age. Your health check might be done in a doctor's office, in your home, or at  a hospital. The goal is to find out if you are having any problems that could make your health problems worse or that make it unsafe for you to be on your  own.  To measure your ADLs, your doctor will ask how hard it is for you to do routine tasks. He or she may also want to know if you have changed the way you do a task because of a health problem. He or she may watch how you:  Walk back and forth.  Keep your balance while you stand or walk.  Move from sitting to standing or from a bed to a chair.  Button or unbutton a Printmaker.  Remove and put on your shoes.  It's normal to feel a little worried or anxious if you find you can't do all the things you used to be able to do. Talking with your doctor about ADLs isn't a test that you either pass or fail. It's just a way to get more information about your health and safety.  Follow-up care is a key part of your treatment and safety. Be sure to make and go to all appointments, and call your doctor if you are having problems. It's also a good idea to know your test results and keep a list of the medicines you take.  Current as of: February 26, 2023Content Version: 13.8   2006-2023 Healthwise, Incorporated.   Care instructions adapted under license by [facility]. If you have questions about a medical condition or this instruction, always ask your healthcare professional. [place] any warranty or liability for your use of this information.           Advance Directives: Care Instructions  Overview  An advance directive is a legal way to state your wishes at the end of your life. It tells your family and your doctor what to do if you can't say what you want.  There are two main types of advance directives. You can change them any time your wishes change.  Living will.  This form tells your family and your doctor your wishes about life support and other treatment. The form is also called a declaration.  Medical power of attorney.  This form lets you name a person to make treatment decisions for you when you can't speak for yourself. This person is called a health care agent  (health care proxy, health care surrogate). The form is also called a durable power of attorney for health care.  If you do not have an advance directive, decisions about your medical care may be made by a family member, or by a doctor or a judge who doesn't know you.  It may help to think of an advance directive as a gift to the people who care for you. If you have one, they won't have to make tough decisions by themselves.  For more information, including forms for your state, see the [place] website (RebankingSpace.hu).  Follow-up care is a key part of your treatment and safety. Be sure to make and go to all appointments, and call your doctor if you are having problems. It's also a good idea to know your test results and keep a list of the medicines you take.  What should you include in an advance directive?  Many states have a unique advance directive form. (It may ask you to address specific issues.) Or you might use a universal form that's approved by many states.  If your form  doesn't tell you what to address, it may be hard to know what to include in your advance directive. Use the questions below to help you get started.  Who do you want to make decisions about your medical care if you are not able to?  What life-support measures do you want if you have a serious illness that gets worse over time or can't be cured?  What are you most afraid of that might happen? (Maybe you're afraid of having pain, losing your independence, or being kept alive by machines.)  Where would you prefer to die? (Your home? A hospital? A nursing home?)  Do you want to donate your organs when you die?  Do you want certain religious practices performed before you die?  When should you call for help?  Be sure to contact your doctor if you have any questions.  Where can you learn more?  Go to https://www.bennett.info/ and enter R264 to learn more about "Advance Directives: Care  Instructions."  Current as of: March 26, 2023Content Version: 13.8   2006-2023 Healthwise, Incorporated.   Care instructions adapted under license by [facility]. If you have questions about a medical condition or this instruction, always ask your healthcare professional. [place] any warranty or liability for your use of this information.           Starting a Weight Loss Plan: Care Instructions  Overview     If you're thinking about losing weight, it can be hard to know where to start. Your doctor can help you set up a weight loss plan that best meets your needs. You may want to take a class on nutrition or exercise, or you could join a weight loss support group. If you have questions about how to make changes to your eating or exercise habits, ask your doctor about seeing a registered dietitian or an exercise specialist.  It can be a big challenge to lose weight. But you don't have to make huge changes at once. Make small changes, and stick with them. When those changes become habit, add a few more changes.  If you don't think you're ready to make changes right now, try to pick a date in the future. Make an appointment to see your doctor to discuss whether the time is right for you to start a plan.  Follow-up care is a key part of your treatment and safety. Be sure to make and go to all appointments, and call your doctor if you are having problems. It's also a good idea to know your test results and keep a list of the medicines you take.  How can you care for yourself at home?  Set realistic goals. Many people expect to lose much more weight than is likely. A weight loss of 5% to 10% of your body weight may be enough to improve your health.  Get family and friends involved to provide support. Talk to them about why you are trying to lose weight, and ask them to help. They can help by participating in exercise and having meals with you, even if they may be eating something  different.  Find what works best for you. If you do not have time or do not like to cook, a program that offers meal replacement bars or shakes may be better for you. Or if you like to prepare meals, finding a plan that includes daily menus and recipes may be best.  Ask your doctor about other health professionals who can  help you achieve your weight loss goals.  A dietitian can help you make healthy changes in your diet.  An exercise specialist or personal trainer can help you develop a safe and effective exercise program.  A counselor or psychiatrist can help you cope with issues such as depression, anxiety, or family problems that can make it hard to focus on weight loss.  Consider joining a support group for people who are trying to lose weight. Your doctor can suggest groups in your area.  Where can you learn more?  Go to https://www.bennett.info/ and enter U357 to learn more about "Starting a Weight Loss Plan: Care Instructions."  Current as of: February 28, 2023Content Version: 13.8   2006-2023 Healthwise, Incorporated.   Care instructions adapted under license by [facility]. If you have questions about a medical condition or this instruction, always ask your healthcare professional. [place] any warranty or liability for your use of this information.           A Healthy Heart: Care Instructions  Your Care Instructions     Coronary artery disease, also called heart disease, occurs when a substance called plaque builds up in the vessels that supply oxygen-rich blood to your heart muscle. This can narrow the blood vessels and reduce blood flow. A heart attack happens when blood flow is completely blocked. A high-fat diet, smoking, and other factors increase the risk of heart disease.  Your doctor has found that you have a chance of having heart disease. You can do lots of things to keep your heart healthy. It may not be easy, but you can change your diet,  exercise more, and quit smoking. These steps really work to lower your chance of heart disease.  Follow-up care is a key part of your treatment and safety. Be sure to make and go to all appointments, and call your doctor if you are having problems. It's also a good idea to know your test results and keep a list of the medicines you take.  How can you care for yourself at home?  Diet   Use less salt when you cook and eat. This helps lower your blood pressure. Taste food before salting. Add only a little salt when you think you need it. With time, your taste buds will adjust to less salt.    Eat fewer snack items, fast foods, canned soups, and other high-salt, high-fat, processed foods.    Read food labels and try to avoid saturated and trans fats. They increase your risk of heart disease by raising cholesterol levels.    Limit the amount of solid fat-butter, margarine, and shortening-you eat. Use olive, peanut, or canola oil when you cook. Bake, broil, and steam foods instead of frying them.    Eat a variety of fruit and vegetables every day. Dark green, deep orange, red, or yellow fruits and vegetables are especially good for you. Examples include spinach, carrots, peaches, and berries.    Foods high in fiber can reduce your cholesterol and provide important vitamins and minerals. High-fiber foods include whole-grain cereals and breads, oatmeal, beans, brown rice, citrus fruits, and apples.    Eat lean proteins. Heart-healthy proteins include seafood, lean meats and poultry, eggs, beans, peas, nuts, seeds, and soy products.    Limit drinks and foods with added sugar. These include candy, desserts, and soda pop.   Lifestyle changes   If your doctor recommends it, get more exercise. Walking is a good choice. Bit by bit, increase  the amount you walk every day. Try for at least 30 minutes on most days of the week. You also may want to swim, bike, or do other activities.    Do not smoke. If you need help  quitting, talk to your doctor about stop-smoking programs and medicines. These can increase your chances of quitting for good. Quitting smoking may be the most important step you can take to protect your heart. It is never too late to quit.    Limit alcohol to 2 drinks a day for men and 1 drink a day for women. Too much alcohol can cause health problems.    Manage other health problems such as diabetes, high blood pressure, and high cholesterol. If you think you may have a problem with alcohol or drug use, talk to your doctor.   Medicines   Take your medicines exactly as prescribed. Call your doctor if you think you are having a problem with your medicine.    If your doctor recommends aspirin, take the amount directed each day. Make sure you take aspirin and not another kind of pain reliever, such as acetaminophen (Tylenol).   When should you call for help?   Call 911 if you have symptoms of a heart attack. These may include:   Chest pain or pressure, or a strange feeling in the chest.    Sweating.    Shortness of breath.    Pain, pressure, or a strange feeling in the back, neck, jaw, or upper belly or in one or both shoulders or arms.    Lightheadedness or sudden weakness.    A fast or irregular heartbeat.   After you call 911, the operator may tell you to chew 1 adult-strength or 2 to 4 low-dose aspirin. Wait for an ambulance. Do not try to drive yourself.  Watch closely for changes in your health, and be sure to contact your doctor if you have any problems.  Where can you learn more?  Go to https://www.bennett.info/ and enter F075 to learn more about "A Healthy Heart: Care Instructions."  Current as of: June 25, 2023Content Version: 13.8   2006-2023 Healthwise, Incorporated.   Care instructions adapted under license by [facility]. If you have questions about a medical condition or this instruction, always ask your healthcare professional. [place]  any warranty or liability for your use of this information.      Personalized Preventive Plan for Kinslea Frances - 01/15/2022  Medicare offers a range of preventive health benefits. Some of the tests and screenings are paid in full while other may be subject to a deductible, co-insurance, and/or copay.    Some of these benefits include a comprehensive review of your medical history including lifestyle, illnesses that may run in your family, and various assessments and screenings as appropriate.    After reviewing your medical record and screening and assessments performed today your provider may have ordered immunizations, labs, imaging, and/or referrals for you.  A list of these orders (if applicable) as well as your Preventive Care list are included within your After Visit Summary for your review.    Other Preventive Recommendations:    A preventive eye exam performed by an eye specialist is recommended every 1-2 years to screen for glaucoma; cataracts, macular degeneration, and other eye disorders.  A preventive dental visit is recommended every 6 months.  Try to get at least 150 minutes of exercise per week or 10,000 steps per day on a pedometer .  Order  or download the FREE "Exercise & Physical Activity: Your Everyday Guide" from [employer]. Call [phone] or search [employer] online.  You need 1200-1500 mg of calcium and 1000-2000 IU of vitamin D per day. It is possible to meet your calcium requirement with diet alone, but a vitamin D supplement is usually necessary to meet this goal.  When exposed to the sun, use a sunscreen that protects against both UVA and UVB radiation with an SPF of 30 or greater. Reapply every 2 to 3 hours or after sweating, drying off with a towel, or swimming.  Always wear a seat belt when traveling in a car. Always wear a helmet when riding a bicycle or motorcycle.  Heart-Healthy Diet   Sodium, Fat, and Cholesterol Controlled Diet        What Is a Heart Healthy Diet?   A heart-healthy diet is one that limits sodium , certain types of fat , and cholesterol . This type of diet is recommended for:   People with any form of cardiovascular disease (eg, coronary heart disease , peripheral vascular disease , previous heart attack , previous stroke )   People with risk factors for cardiovascular disease, such as high blood pressure , high cholesterol , or diabetes   Anyone who wants to lower their risk of developing cardiovascular disease   Sodium    Sodium is a mineral found in many foods. In general, most people consume much more sodium than they need. Diets high in sodium can increase blood pressure and lead to edema (water retention). On a heart-healthy diet, you should consume no more than 2,300 mg (milligrams) of sodium per dayabout the amount in one teaspoon of table salt. The foods highest in sodium include table salt (about 50% sodium), processed foods, convenience foods, and preserved foods.   Cholesterol    Cholesterol is a fat-like, waxy substance in your blood. Our bodies make some cholesterol. It is also found in animal products, with the highest amounts in fatty meat, egg yolks, whole milk, cheese, shellfish, and organ meats. On a heart-healthy diet, you should limit your cholesterol intake to less than 200 mg per day.   It is normal and important to have some cholesterol in your bloodstream. But too much cholesterol can cause plaque to build up within your arteries, which can eventually lead to a heart attack or stroke.   The two types of cholesterol that are most commonly referred to are:   Low-density lipoprotein (LDL) cholesterol  Also known as bad cholesterol, this is the cholesterol that tends to build up along your arteries. Bad cholesterol levels are increased by eating fats that are saturated or hydrogenated. Optimal level of this cholesterol is less than 100. Over 130 starts to get risky for heart disease.   High-density  lipoprotein (HDL) cholesterol  Also known as good cholesterol, this type of cholesterol actually carries cholesterol away from your arteries and may, therefore, help lower your risk of having a heart attack. You want this level to be high (ideally greater than 60). It is a risk to have a level less than 40. You can raise this good cholesterol by eating olive oil, canola oil, avocados, or nuts. Exercise raises this level, too.   Fat    Fat is calorie dense and packs a lot of calories into a small amount of food. Even though fats should be limited due to their high calorie content, not all fats are bad. In  fact, some fats are quite healthful. Fat can be broken down into four main types.   The good-for-you fats are:   Monounsaturated fat  found in oils such as olive and canola, avocados, and nuts and natural nut butters; can decrease cholesterol levels, while keeping levels of HDL cholesterol high   Polyunsaturated fat  found in oils such as safflower, sunflower, soybean, corn, and sesame; can decrease total cholesterol and LDL cholesterol   Omega-3 fatty acids  particularly those found in fatty fish (such as salmon, trout, tuna, mackerel, herring, and sardines); can decrease risk of arrhythmias, decrease triglyceride levels, and slightly lower blood pressure   The fats that you want to limit are:   Saturated fat  found in animal products, many fast foods, and a few vegetables; increases total blood cholesterol, including LDL levels   Animal fats that are saturated include: butter, lard, whole-milk dairy products, meat fat, and poultry skin   Vegetable fats that are saturated include: hydrogenated shortening, palm oil, coconut oil, cocoa butter   Hydrogenated or trans fat  found in margarine and vegetable shortening, most shelf stable snack foods, and fried foods; increases LDL and decreases HDL     It is generally recommended that you limit your total fat for the day to less than 30% of your total calories. If you  follow an 1800-calorie heart healthy diet, for example, this would mean 60 grams of fat or less per day.   Saturated fat and trans fat in your diet raises your blood cholesterol the most, much more than dietary cholesterol does. For this reason, on a heart-healthy diet, less than 7% of your calories should come from saturated fat and ideally 0% from trans fat. On an 1800-calorie diet, this translates into less than 14 grams of saturated fat per day, leaving 46 grams of fat to come from mono- and polyunsaturated fats.   Food Choices on a Heart Healthy Diet   Food Category   Foods Recommended   Foods to Avoid   Grains   Breads and rolls without salted tops Most dry and cooked cereals Unsalted crackers and breadsticks Low-sodium or homemade breadcrumbs or stuffing All rice and pastas   Breads, rolls, and crackers with salted tops High-fat baked goods (eg, muffins, donuts, pastries) Quick breads, self-rising flour, and biscuit mixes Regular bread crumbs Instant hot cereals Commercially prepared rice, pasta, or stuffing mixes   Vegetables   Most fresh, frozen, and low-sodium canned vegetables Low-sodium and salt-free vegetable juices Canned vegetables if unsalted or rinsed   Regular canned vegetables and juices, including sauerkraut and pickled vegetables Frozen vegetables with sauces Commercially prepared potato and vegetable mixes   Fruits   Most fresh, frozen, and canned fruits All fruit juices   Fruits processed with salt or sodium   Milk   Nonfat or low-fat (1%) milk Nonfat or low-fat yogurt Cottage cheese, low-fat ricotta, cheeses labeled as low-fat and low-sodium   Whole milk Reduced-fat (2%) milk Malted and chocolate milk Full fat yogurt Most cheeses (unless low-fat and low salt) Buttermilk (no more than 1 cup per week)   Meats and Beans   Lean cuts of fresh or frozen beef, veal, lamb, or pork (look for the word loin) Fresh or frozen poultry without the skin Fresh or frozen fish and some shellfish Egg whites and  egg substitutes (Limit whole eggs to three per week) Tofu Nuts or seeds (unsalted, dry-roasted), low-sodium peanut butter Dried peas, beans, and lentils   Any smoked, cured,  salted, or canned meat, fish, or poultry (including bacon, chipped beef, cold cuts, hot dogs, sausages, sardines, and anchovies) Poultry skins Breaded and/or fried fish or meats Canned peas, beans, and lentils Salted nuts   Fats and Oils   Olive oil and canola oil Low-sodium, low-fat salad dressings and mayonnaise   Butter, margarine, coconut and palm oils, bacon fat   Snacks, Sweets, and Condiments   Low-sodium or unsalted versions of broths, soups, soy sauce, and condiments Pepper, herbs, and spices; vinegar, lemon, or lime juice Low-fat frozen desserts (yogurt, sherbet, fruit bars) Sugar, cocoa powder, honey, syrup, jam, and preserves Low-fat, trans-fat free cookies, cakes, and pies Graham and animal crackers, fig bars, ginger snaps   High-fat desserts Broth, soups, gravies, and sauces, made from instant mixes or other high-sodium ingredients Salted snack foods Canned olives Meat tenderizers, seasoning salt, and most flavored vinegars   Beverages   Low-sodium carbonated beverages Tea and coffee in moderation Soy milk   Commercially softened water   Suggestions   Make whole grains, fruits, and vegetables the base of your diet.    Choose heart-healthy fats such as canola, olive, and flaxseed oil, and foods high in heart-healthy fats, such as nuts, seeds, soybeans, tofu, and fish.    Eat fish at least twice per week; the fish highest in omega-3 fatty acids and lowest in mercury include salmon, herring, mackerel, sardines, and canned chunk light tuna. If you eat fish less than twice per week or have high triglycerides, talk to your doctor about taking fish oil supplements.    Read food labels.   For products low in fat and cholesterol, look for fat free, low-fat, cholesterol free, saturated fat free, and trans fat freeAlso scan the Nutrition  Facts Label, which lists saturated fat, trans fat, and cholesterol amounts.   For products low in sodium, look for sodium free, very low sodium, low sodium, no added salt, and unsalted   Skip the salt when cooking or at the table; if food needs more flavor, get creative and try out different herbs and spices. Garlic and onion also add substantial flavor to foods.    Trim any visible fat off meat and poultry before cooking, and drain the fat off after browning.    Use cooking methods that require little or no added fat, such as grilling, boiling, baking, poaching, broiling, roasting, steaming, stir-frying, and sauting.    Avoid fast food and convenience food. They tend to be high in saturated and trans fat and have a lot of added salt.    Talk to a registered dietitian for individualized diet advice.      Last Reviewed: March 2011 Suzanne Boron, MS, MPH, RD   Updated: 05/14/2009     Heart-Healthy Diet   Sodium, Fat, and Cholesterol Controlled Diet       What Is a Heart Healthy Diet?   A heart-healthy diet is one that limits sodium , certain types of fat , and cholesterol . This type of diet is recommended for:   People with any form of cardiovascular disease (eg, coronary heart disease , peripheral vascular disease , previous heart attack , previous stroke )   People with risk factors for cardiovascular disease, such as high blood pressure , high cholesterol , or diabetes   Anyone who wants to lower their risk of developing cardiovascular disease   Sodium    Sodium is a mineral found in many foods. In general, most people consume much more sodium than they  need. Diets high in sodium can increase blood pressure and lead to edema (water retention). On a heart-healthy diet, you should consume no more than 2,300 mg (milligrams) of sodium per dayabout the amount in one teaspoon of table salt. The foods highest in sodium include table salt (about 50% sodium), processed foods, convenience foods, and preserved foods.    Cholesterol    Cholesterol is a fat-like, waxy substance in your blood. Our bodies make some cholesterol. It is also found in animal products, with the highest amounts in fatty meat, egg yolks, whole milk, cheese, shellfish, and organ meats. On a heart-healthy diet, you should limit your cholesterol intake to less than 200 mg per day.   It is normal and important to have some cholesterol in your bloodstream. But too much cholesterol can cause plaque to build up within your arteries, which can eventually lead to a heart attack or stroke.   The two types of cholesterol that are most commonly referred to are:   Low-density lipoprotein (LDL) cholesterol  Also known as bad cholesterol, this is the cholesterol that tends to build up along your arteries. Bad cholesterol levels are increased by eating fats that are saturated or hydrogenated. Optimal level of this cholesterol is less than 100. Over 130 starts to get risky for heart disease.   High-density lipoprotein (HDL) cholesterol  Also known as good cholesterol, this type of cholesterol actually carries cholesterol away from your arteries and may, therefore, help lower your risk of having a heart attack. You want this level to be high (ideally greater than 60). It is a risk to have a level less than 40. You can raise this good cholesterol by eating olive oil, canola oil, avocados, or nuts. Exercise raises this level, too.   Fat    Fat is calorie dense and packs a lot of calories into a small amount of food. Even though fats should be limited due to their high calorie content, not all fats are bad. In fact, some fats are quite healthful. Fat can be broken down into four main types.   The good-for-you fats are:   Monounsaturated fat  found in oils such as olive and canola, avocados, and nuts and natural nut butters; can decrease cholesterol levels, while keeping levels of HDL cholesterol high   Polyunsaturated fat  found in oils such as safflower, sunflower, soybean,  corn, and sesame; can decrease total cholesterol and LDL cholesterol   Omega-3 fatty acids  particularly those found in fatty fish (such as salmon, trout, tuna, mackerel, herring, and sardines); can decrease risk of arrhythmias, decrease triglyceride levels, and slightly lower blood pressure   The fats that you want to limit are:   Saturated fat  found in animal products, many fast foods, and a few vegetables; increases total blood cholesterol, including LDL levels   Animal fats that are saturated include: butter, lard, whole-milk dairy products, meat fat, and poultry skin   Vegetable fats that are saturated include: hydrogenated shortening, palm oil, coconut oil, cocoa butter   Hydrogenated or trans fat  found in margarine and vegetable shortening, most shelf stable snack foods, and fried foods; increases LDL and decreases HDL     It is generally recommended that you limit your total fat for the day to less than 30% of your total calories. If you follow an 1800-calorie heart healthy diet, for example, this would mean 60 grams of fat or less per day.   Saturated fat and trans fat in your  diet raises your blood cholesterol the most, much more than dietary cholesterol does. For this reason, on a heart-healthy diet, less than 7% of your calories should come from saturated fat and ideally 0% from trans fat. On an 1800-calorie diet, this translates into less than 14 grams of saturated fat per day, leaving 46 grams of fat to come from mono- and polyunsaturated fats.   Food Choices on a Heart Healthy Diet   Food Category   Foods Recommended   Foods to Avoid   Grains   Breads and rolls without salted tops Most dry and cooked cereals Unsalted crackers and breadsticks Low-sodium or homemade breadcrumbs or stuffing All rice and pastas   Breads, rolls, and crackers with salted tops High-fat baked goods (eg, muffins, donuts, pastries) Quick breads, self-rising flour, and biscuit mixes Regular bread crumbs Instant hot cereals  Commercially prepared rice, pasta, or stuffing mixes   Vegetables   Most fresh, frozen, and low-sodium canned vegetables Low-sodium and salt-free vegetable juices Canned vegetables if unsalted or rinsed   Regular canned vegetables and juices, including sauerkraut and pickled vegetables Frozen vegetables with sauces Commercially prepared potato and vegetable mixes   Fruits   Most fresh, frozen, and canned fruits All fruit juices   Fruits processed with salt or sodium   Milk   Nonfat or low-fat (1%) milk Nonfat or low-fat yogurt Cottage cheese, low-fat ricotta, cheeses labeled as low-fat and low-sodium   Whole milk Reduced-fat (2%) milk Malted and chocolate milk Full fat yogurt Most cheeses (unless low-fat and low salt) Buttermilk (no more than 1 cup per week)   Meats and Beans   Lean cuts of fresh or frozen beef, veal, lamb, or pork (look for the word loin) Fresh or frozen poultry without the skin Fresh or frozen fish and some shellfish Egg whites and egg substitutes (Limit whole eggs to three per week) Tofu Nuts or seeds (unsalted, dry-roasted), low-sodium peanut butter Dried peas, beans, and lentils   Any smoked, cured, salted, or canned meat, fish, or poultry (including bacon, chipped beef, cold cuts, hot dogs, sausages, sardines, and anchovies) Poultry skins Breaded and/or fried fish or meats Canned peas, beans, and lentils Salted nuts   Fats and Oils   Olive oil and canola oil Low-sodium, low-fat salad dressings and mayonnaise   Butter, margarine, coconut and palm oils, bacon fat   Snacks, Sweets, and Condiments   Low-sodium or unsalted versions of broths, soups, soy sauce, and condiments Pepper, herbs, and spices; vinegar, lemon, or lime juice Low-fat frozen desserts (yogurt, sherbet, fruit bars) Sugar, cocoa powder, honey, syrup, jam, and preserves Low-fat, trans-fat free cookies, cakes, and pies Graham and animal crackers, fig bars, ginger snaps   High-fat desserts Broth, soups, gravies, and sauces, made from  instant mixes or other high-sodium ingredients Salted snack foods Canned olives Meat tenderizers, seasoning salt, and most flavored vinegars   Beverages   Low-sodium carbonated beverages Tea and coffee in moderation Soy milk   Commercially softened water   Suggestions   Make whole grains, fruits, and vegetables the base of your diet.    Choose heart-healthy fats such as canola, olive, and flaxseed oil, and foods high in heart-healthy fats, such as nuts, seeds, soybeans, tofu, and fish.    Eat fish at least twice per week; the fish highest in omega-3 fatty acids and lowest in mercury include salmon, herring, mackerel, sardines, and canned chunk light tuna. If you eat fish less than twice per week or have high triglycerides,  talk to your doctor about taking fish oil supplements.    Read food labels.   For products low in fat and cholesterol, look for fat free, low-fat, cholesterol free, saturated fat free, and trans fat freeAlso scan the Nutrition Facts Label, which lists saturated fat, trans fat, and cholesterol amounts.   For products low in sodium, look for sodium free, very low sodium, low sodium, no added salt, and unsalted   Skip the salt when cooking or at the table; if food needs more flavor, get creative and try out different herbs and spices. Garlic and onion also add substantial flavor to foods.    Trim any visible fat off meat and poultry before cooking, and drain the fat off after browning.    Use cooking methods that require little or no added fat, such as grilling, boiling, baking, poaching, broiling, roasting, steaming, stir-frying, and sauting.    Avoid fast food and convenience food. They tend to be high in saturated and trans fat and have a lot of added salt.    Talk to a registered dietitian for individualized diet advice.      Last Reviewed: March 2011 Suzanne Boron, MS, MPH, RD   Updated: 05/14/2009       Preventing Osteoporosis: After Your Visit  Your Care Instructions  Osteoporosis means the bones  are weak and thin enough that they can break easily. The older you are, the more likely you are to get osteoporosis. But with plenty of calcium, vitamin D, and exercise, you can help prevent osteoporosis.  The preteen and teen years are a key time for bone building. With the help of calcium, vitamin D, and exercise in those early years and beyond, the bones reach their peak density and strength by age 28. After age 58, your bones naturally start to thin and weaken.  The stronger your bones are at around age 93, the lower your risk for osteoporosis. But no matter what your age and risk are, your bones still need calcium, vitamin D, and exercise to stay strong. Also avoid smoking, and limit alcohol. Smoking and heavy alcohol use can make your bones thinner.  Talk to your doctor about any special risks you might have, such as having a close relative with osteoporosis or taking a medicine that can weaken bones. Your doctor can tell you the best ways to protect your bones from thinning.  Follow-up care is a key part of your treatment and safety. Be sure to make and go to all appointments, and call your doctor if you are having problems. It's also a good idea to know your test results and keep a list of the medicines you take.  How can you care for yourself at home?  Get enough calcium and vitamin D. The Institute of Medicine recommends adults younger than age 27 need 1,000 mg of calcium and 600 IU of vitamin D each day. Women ages 62 to 66 need 1,200 mg of calcium and 600 IU of vitamin D each day. Men ages 74 to 43 need 1,000 mg of calcium and 600 IU of vitamin D each day. Adults 71 and older need 1,200 mg of calcium and 800 IU of vitamin D each day.  Eat foods rich in calcium, like yogurt, cheese, milk, and dark green vegetables.  Eat foods rich in vitamin D, like eggs, fatty fish, cereal, and fortified milk.  Get some sunshine. Your body uses sunshine to make its own vitamin D. The safest time to be out  in the sun is  before 10 a.m. or after 3 p.m. Avoid getting sunburned. Sunburn can increase your risk of skin cancer.  Talk to your doctor about taking a calcium plus vitamin D supplement. Ask about what type of calcium is right for you, and how much to take at a time. Adults ages 26 to 68 should not get more than 2,500 mg of calcium and 4,000 IU of vitamin D each day, whether it is from supplements and/or food. Adults ages 37 and older should not get more than 2,000 mg of calcium and 4,000 IU of vitamin D each day from supplements and/or food.  Get regular bone-building exercise. Weight-bearing and resistance exercises keep bones healthy by working the muscles and bones against gravity. Start out at an exercise level that feels right for you. Add a little at a time until you can do the following:  Do 30 minutes of weight-bearing exercise on most days of the week. Walking, jogging, stair climbing, and dancing are good choices.  Do resistance exercises with weights or elastic bands 2 to 3 days a week.  Limit alcohol. Drink no more than 1 alcohol drink a day if you are a woman. Drink no more than 2 alcohol drinks a day if you are a man.  Do not smoke. Smoking can make bones thin faster. If you need help quitting, talk to your doctor about stop-smoking programs and medicines. These can increase your chances of quitting for good.  When should you call for help?  Watch closely for changes in your health, and be sure to contact your doctor if:  You need help with a healthy eating plan.  You need help with an exercise plan     2006-2012 [employer]. Care instructions adapted under license by [facility]. This care instruction is for use with your licensed healthcare professional. If you have questions about a medical condition or this instruction, always ask your healthcare professional. [employer] any warranty or liability for your use of this information.  Content Version: 9.4.94723;  Last Revised: August 05, 2009                Keep Your Memory Hervey Ard       Many factors can affect your ability to remembera hectic lifestyle, aging, stress, chronic disease, and certain medicines. But, there are steps you can take to sharpen your mind and help preserve your memory.   Challenge Your Brain   Regularly challenging your mind may help keeps it in top shape. Good mental exercises include:   Crossword puzzlesUse a dictionary if you need it; you will learn more that way.   Brainteasers Try some!   Crafts, such as wood working and Financial risk analyst, such as gardening and Research officer, political party old friends or join groups to meet new ones.   Reading   Learning a new language   Taking a class, whether it be art history or tai chi   TravelingExperience the food, history, and culture of your destination   Learning to use a computer   Going to museums, the theater, or thought-provoking movies   Changing things in your daily life, such as reversing your pattern in the grocery store or brushing your teeth using your nondominant hand   Use Memory Aids   There is no need to remember every detail on your own. These memory aids can help:   Calendars and day Presenter, broadcasting to  store all sorts of helpful informationThese devices can "beep" to remind you of appointments.   A book of days to record birthdays, anniversaries, and other occasions that occur on the same date every year   Detailed "to-do" lists and strategically placed sticky notes   Quick "study" sessionsBefore a gathering, review who will be there so their names will be fresh in your mind.   Establish routinesFor example, keep your keys, wallet, and umbrella in the same place all the time or take medicine with your 8:00 AM glass of juice   Live a Healthy Life   Many actions that will keep your body strong will do the same for your mind. For example:   Talk to Your Doctor About Herbs and Supplements    Malnutrition and vitamin  deficiencies can impair your mental function. For example, vitamin B12 deficiency can cause a range of symptoms, including confusion. But, what if your nutritional needs are being met? Can herbs and supplements still offer a benefit? Researchers have investigated a range of natural remedies, such as ginkgo , ginseng , and the supplement phosphatidylserine (PS). So far, though, the evidence is inconsistent as to whether these products can improve memory or thinking.   If you are interested in taking herbs and supplements, talk to your doctor first because they may interact with other medicines that you are taking.   Exercise Regularly    Among the many benefits of regular exercise are increased blood flow to the brain and decreased risk of certain diseases that can interfere with memory function. One study found that even moderate exercise has a beneficial effect. Examples of "moderate" exercise include:   Playing 18 holes of golf once a week, without a cart   Playing tennis twice a week   Walking one mile per day   Manage Stress    It can be tough to remember what is important when your mind is cluttered. Make time for relaxation. Choose activities that calm you down, and make it routine.   Manage Chronic Conditions    Side effects of high blood pressure , diabetes, and heart disease can interfere with mental function. Many of the lifestyle steps discussed here can help manage these conditions. Strive to eat a healthy diet, exercise regularly, get stress under control, and follow your doctor's advice for your condition.   Minimize Medications    Talk to your doctor about the medicines that you take. Some may be unnecessary. Also, healthy lifestyle habits may lower the need for certain drugs.     Last Reviewed: April 2010 Lajuana Carry, MD   Updated: 05/29/2008     Keeping Home a [place]       As we get older, changes in balance, gait, strength, vision, hearing, and cognition make even the most youthful senior more  prone to accidents. Falls are one of the leading health risks for older people. This increased risk of falling is related to:   Aging process (eg, decreased muscle strength, slowed reflexes)   Higher incidence of chronic health problems (eg, arthritis, diabetes) that may limit mobility, agility or sensory awareness   Side effects of medicine (eg, dizziness, blurred vision)especially medicines like prescription pain medicines and drugs used to treat mental health conditions   Depending on the brittleness of your bones, the consequences of a fall can be serious and long lasting.   Home Life   Research by the Association of Aging [facility]) shows that some home accidents among older adults  can be prevented by making simple lifestyle changes and basic modifications and repairs to the home environment. Here are some lifestyle changes that experts recommend:   Have your hearing and vision checked regularly. Be sure to wear prescription glasses that are right for you.   Speak to your doctor or pharmacist about the possible side effects of your medicines. A number of medicines can cause dizziness.   If you have problems with sleep, talk to your doctor.   Limit your intake of alcohol.   If necessary, use a cane or walker to help maintain your balance.   Wear supportive, rubber-soled shoes, even at home. If you live in a region that gets wintry weather, you may want to put special cleats on your shoes to prevent you from slipping on the snow and ice.   Exercise regularly to help maintain muscle tone, agility, and balance.   Always hold the banister when going up or down stairs. Also, use grip bars when getting in or out of the bath or shower, or using the toilet.   To avoid dizziness, get up slowly from a lying down position. Sit up first, dangling your legs for a minute or two before rising to a standing position.   Overall Home Safety Check   According to the Consumer Product Safety Commision's "Older Consumer Home Safety  Checklist," it is important to check for potential hazards in each room. And remember, proper lighting is an essential factor in home safety. If you cannot see clearly, you are more likely to fall.   Important questions to ask yourself include:   Are lamp, electric, extension, and telephone cords placed out of the flow of traffic and maintained in good condition? Have frayed cords been replaced?   Are all small rugs and runners slip resistant? If not, you can secure them to the floor with a special double-sided carpet tape.   Are smoke detectors properly locatedone on every floor of your home and one outside of every sleeping area? Are they in good working order? Are batteries replaced at least once a year?   Do you have a well-maintained carbon monoxide detector outside every sleeping are in your home?   Does your furniture layout leave plenty of space to maneuver between and around chairs, tables, beds, and sofas?   Are hallways, stairs and passages between rooms well lit? Can you reach a lamp without getting out of bed?   Are floor surfaces well maintained? Shag rugs, high-pile carpeting, tile floors, and polished wood floors can be particularly slippery. Stairs should always have handrails and be carpeted or fitted with a non-skid tread.   Is your telephone easily reachable. Is the cord safely tucked away?   Room by Room   According to the Association of Aging, bathrooms and kitchens are the two most potentially hazardous rooms in your home.   In the Kitchen    Be sure your stove is in proper working order and always make sure burners and the oven are off before you go out or go to sleep.    Keep pots on the back burners, turn handles away from the front of the stove, and keep stove clean and free of grease build-up.    Kitchen ventilation systems and range exhausts should be working properly.    Keep flammable objects such as towels and pot holders away from the cooking area except when in use. Make sure  kitchen curtains are tied back.    Move cords  and appliances away from the sink and hot surfaces. If extension cords are needed, install wiring guides so they do not hang over the sink, range, or working areas.    Look for coffee pots, kettles and toaster ovens with automatic shut-offs.    Keep a mop handy in the kitchen so you can wipe up spills instantly. You should also have a small Data processing manager.    Arrange your kitchen with frequently used items on lower shelves to avoid the need to stand on a stepstool to reach them.    Make sure countertops are well-lit to avoid injuries while cutting and preparing food.    In the Bathroom    Use a non-slip mat or decals in the tub and shower, since wet, soapy tile or porcelain surfaces are extremely slippery.    Make sure bathroom rugs are non-skid or tape them firmly to the floor. Bathtubs should have at least one, preferably two, grab bars, firmly attached to structural supports in the wall. (Do not use built-in soap holders or glass shower doors as grab bars.)    Tub seats fitted with non-slip material on the legs allow you to wash sitting down. For people with limited mobility, bathtub transfer benches allow you to slide safely into the tub.    Raised toilet seats and toilet safety rails are helpful for those with knee or hip problems.    In the [place]    Make sure you use a nightlight and that the area around your bed is clear of potential obstacles.    Be careful with electric blankets and never go to sleep with a heating pad, which can cause serious burns even if on a low setting.    Use fire-resistant mattress covers and pillows, and NEVER smoke in bed.    Keep a phone next to the bed that is programmed to dial 911 at the push of a button.      If you have a chronic condition, you may want to sign on with an automatic call-in service. Typically the system includes a small pendant that connects directly to an emergency medical voice-response system. You should  also make arrangements to stay in contact with someonefriend, neighbor, family memberon a regular schedule.   Fire Prevention   According to the [employer] (Smoke Alarms for Every) [place], senior citizens are one of the two highest risk groups for death and serious injuries due to residential fires.   When cooking, wear short-sleeved items, never a bulky long-sleeved robe.    The CPSC's Safety Checklist for Older Consumers emphasizes the importance of checking basements, garages, workshops and storage areas for fire hazards, such as volatile liquids, piles of old rags or clothing and overloaded circuits.    Never smoke in bed or when lying down on a couch or recliner chair.    Small portable electric or kerosene heaters are responsible for many home fires and should be used cautiously if at all. If you do use one, be sure to keep them away from flammable materials.    In case of fire, make sure you have a pre-established emergency exit plan.    Have a professional check your fireplace and other fuel-burning appliances yearly.    Helping Hands   Baby boomers entering the golden years will continue to see the development of new products to help older adults live safely and independently in spite of age-related changes.  Making Life More Livable  , by [employer],  lists over 1,000 products for "living well in the mature years," such as bathing and mobility aids, household security devices, ergonomically designed knives and peelers, and faucet valves and knobs for temperature control. Medical supply stores and organizations are good sources of information about products that improve your quality of life and insure your safety.     Last Reviewed: November 2009 Lajuana Carry, MD   Updated: 04/22/2009            Advance Care Planning: Care Instructions  Overview     It can be hard to live with an illness that cannot be cured. But if your health is getting worse, you may want to make decisions about  end-of-life care. Planning for the end of your life does not mean that you are giving up. It is a way to make sure that your wishes are met. Clearly stating your wishes can make it easier for your loved ones. Making plans while you are still able may also ease your mind and make your final days less stressful and more meaningful.  Follow-up care is a key part of your treatment and safety. Be sure to make and go to all appointments, and call your doctor if you are having problems. It's also a good idea to know your test results and keep a list of the medicines you take.  What can you do to plan for the end of life?  You can bring these issues up with your doctor. You do not need to wait until your doctor starts the conversation. You might start with, "What makes life worth living for me is. . ." And then follow it with, "I would not be willing to live with . . ." When you complete this sentence it helps your doctor understand your wishes.  Talk openly and honestly with your doctor. This is the best way to understand the decisions you will need to make as your health changes. Know that you can always change your mind.  Ask your doctor about commonly used life-support measures. These include tube feedings, breathing machines, and fluids given through a vein (I.V.). Understanding these treatments will help you decide whether you want them.  You may choose to have these life-supporting treatments for a limited time. This allows a trial period to see whether they will help you. You may also decide that you want your doctor to take only certain measures to keep you alive. It may help to think about the big picture, like what makes life worth living for you or what your values and goals are.  Talk to your doctor about how long you are likely to live. Your doctor may be able to give you an idea of what usually happens with your specific illness.  Think about preparing papers that state your wishes. These papers are called  advance directives. If you do this early and review them often, there will not be any confusion about what you want. You can change your instructions at any time.  Which papers should you prepare?  Advance directives are legal papers that tell doctors how you want to be cared for at the end of your life. You do not need a lawyer to write these papers. Ask your doctor or your state health department for information on how to write your advance directives. They may have the forms for each of these types of papers. Make sure your doctor has a copy of these on file, and give a copy to a  family member or close friend.  Consider a do-not-resuscitate order (DNR). This order asks that no extra treatments be done if your heart stops or you stop breathing. Extra treatments may include cardiopulmonary resuscitation (CPR), electrical shock to restart your heart, or a machine to breathe for you. If you decide to have a DNR order, ask your doctor to explain and write it. Place the order in your home where everyone can easily see it.  Consider a living will. A living will explains your wishes about life support and other treatments at the end of your life if you become unable to speak for yourself. Living wills tell doctors to use or not use treatments that would keep you alive. You must have one or two witnesses or a notary present when you sign this form. A living will may be called something else in your state.  Consider a medical power of attorney. This form allows you to name a person to make decisions about your care if you are not able to. Most people ask a close friend or family member. Talk to this person about the kinds of treatments you want and those that you do not want. Make sure this person understands your wishes. A medical power of attorney may be called something else in your state.  These legal papers are simple to change. Tell your doctor what you want to change, and ask him or her to make a note in your  medical file. Give your family updated copies of the papers.  Where can you learn more?  Go to https://www.bennett.info/ and enter P184 to learn more about "Advance Care Planning: Care Instructions."  Current as of: February 26, 2023Content Version: 13.8   2006-2023 Healthwise, Incorporated.   Care instructions adapted under license by [facility]. If you have questions about a medical condition or this instruction, always ask your healthcare professional. [place] any warranty or liability for your use of this information.           Learning About Living Eugenie Birks  What is a living will?     A living will, also called a declaration, is a legal form. It tells your family and your doctor your wishes when you can't speak for yourself. It's used by the health professionals who will treat you as you near the end of your life or if you get seriously hurt or ill.  If you put your wishes in writing, your loved ones and others will know what kind of care you want. They won't need to guess. This can ease your mind and be helpful to others. And you can change or cancel your living will at any time.  A living will is not the same as an estate or property will. An estate will explains what you want to happen with your money and property after you die.  How do you use it?  Keep these facts in mind about how a living will is used.  Your living will is used only if you can't speak or make decisions for yourself. Most often, one or more doctors must certify that you can't speak or decide for yourself before your living will takes effect.  If you get better and can speak for yourself again, you can accept or refuse any treatment. It doesn't matter what you said in your living will.  Some states may limit your right to refuse treatment in certain cases. For example, you may need to clearly state in your living  will that you don't want artificial hydration and nutrition, such as being  fed through a tube.  Is a living will a legal document?  A living will is a legal document. Each state has its own laws about living wills. And a living will may be called something else in your state.  Here are some things to know about living wills.  You don't need an attorney to complete a living will. But legal advice can be helpful if your state's laws are unclear. It can also help if your health history is complicated or your family can't agree on what should be in your living will.  You can change your living will at any time. Some people find that their wishes about end-of-life care change as their health changes. If you make big changes to your living will, complete a new form.  If you move to another state, make sure that your living will is legal in the state where you now live. In most cases, doctors will respect your wishes even if you have a form from a different state.  You might use a universal form that has been approved by many states. This kind of form can sometimes be filled out and stored online. Your digital copy will then be available wherever you have a connection to the internet. The doctors and nurses who need to treat you can find it right away.  Your state may offer an online registry. This is another place where you can store your living will online.  It's a good idea to get your living will notarized. This means using a person called a notary public to watch two people sign, or witness, your living will.  What should you know when you create a living will?  Here are some questions to ask yourself as you make your living will.  Do you know enough about life support methods that might be used? If not, talk to your doctor so you know what might be done if you can't breathe on your own, your heart stops, or you can't swallow.  What things would you still want to be able to do after you receive life-support methods? Would you want to be able to walk? To speak? To eat on your own? To live  without the help of machines?  Do you want certain religious practices performed if you become very ill?  If you have a choice, where do you want to be cared for? In your home? At a hospital or nursing home?  If you have a choice at the end of your life, where would you prefer to die? At home? In a hospital or nursing home? Somewhere else?  Would you prefer to be buried or cremated?  Do you want your organs to be donated after you die?  What should you do with your living will?  Make sure that your family members and your health care agent have copies of your living will (also called a declaration).  Give your doctor a copy of your living will. Ask to have it kept as part of your medical record. If you have more than one doctor, make sure that each one has a copy.  Put a copy of your living will where it can be easily found. For example, some people may put a copy on their refrigerator door. If you are using a digital copy, be sure your doctor, family members, and health care agent know how to find and access it.  Where can you learn more?  Go to https://www.bennett.info/ and enter K356 to learn more about "[place]."  Current as of: March 26, 2023Content Version: 13.8   2006-2023 Healthwise, Incorporated.   Care instructions adapted under license by [facility]. If you have questions about a medical condition or this instruction, always ask your healthcare professional. [place] any warranty or liability for your use of this information.           Learning About Medical Power of Attorney  What is a medical power of attorney?     A medical power of attorney, also called a durable power of attorney for health care, is one type of the legal forms called advance directives. It lets you name the person you want to make treatment decisions for you if you can't speak or decide for yourself. The person you choose is called your health care agent. This  person is also called a health care proxy or health care surrogate.  A medical power of attorney may be called something else in your state.  How do you choose a health care agent?  Choose your health care agent carefully. This person may or may not be a family member.  Talk to the person before you make your final decision. Make sure this person is comfortable with this responsibility.  It's a good idea to choose someone who:  Is at least 73 years old.  Knows you well and understands what makes life meaningful for you.  Understands your religious and moral values.  Will do what you want, not what that person wants.  Will be able to make difficult choices at a stressful time.  Will be able to refuse or stop treatment, if that is what you would want, even if you could die.  Will be firm and confident with health professionals if needed.  Will ask questions to get needed information.  Lives near you or agrees to travel to you if needed.  Your family may help you make medical decisions while you can still be part of that process. But it's important to choose one person to be your health care agent in case you aren't able to make decisions for yourself.  If you don't fill out the legal form and name a health care agent, the decisions your family can make may be limited.  A health care agent may be called something else in your state.  Who will make decisions for you if you don't have a health care agent?  If you don't have a health care agent or a living will, you may not get the care you want. Decisions may be made by family members who disagree about your medical care. Or decisions may be made by a medical professional who doesn't know you well. In some cases, a judge makes the decisions.  When you name a health care agent, it is very clear who has the power to make health decisions for you.  How do you name a health care agent?  You name your health care agent on a legal form. This form is usually called a medical  power of attorney. Ask your hospital, state bar association, or office on aging where to find these forms.  You must sign the form to make it legal. Some states require you to get the form notarized. This means that a person called a notary public watches you sign the form and then the notary signs the form. Some  states also require that two or more witnesses sign the form.  Be sure to tell your family members and doctors who your health care agent is.  Where can you learn more?  Go to https://www.bennett.info/ and enter P737 to learn more about "Learning About [place]."  Current as of: February 26, 2023Content Version: 13.8   2006-2023 Healthwise, Incorporated.   Care instructions adapted under license by [facility]. If you have questions about a medical condition or this instruction, always ask your healthcare professional. [place] any warranty or liability for your use of this information.

## 2022-01-15 NOTE — Progress Notes (Signed)
Medicare Annual Wellness Visit    Margaret Hopkins is here for Medicare AWV    Assessment & Plan   Initial Medicare annual wellness visit  Recommendations for Preventive Services Due: see orders and patient instructions/AVS.  Recommended screening schedule for the next 5-10 years is provided to the patient in written form: see Patient Instructions/AVS.     Return in 1 year (on 01/16/2023).     Subjective   Margaret Hopkins is doing fair. She is using braces on both knees and her walker. She is excited about meeting new friends where she is living. She is trying to start exercising more to help with the knees.     Patient's complete Health Risk Assessment and screening values have been reviewed and are found in Flowsheets. The following problems were reviewed today and where indicated follow up appointments were made and/or referrals ordered.    Positive Risk Factor Screenings with Interventions:    Fall Risk:  Do you feel unsteady or are you worried about falling? : (!) yes (uses braces and walker)  2 or more falls in past year?: no  Fall with injury in past year?: no     Interventions:    Patient declines any further evaluation or treatment              Weight and Activity:  Physical Activity: Insufficiently Active (01/15/2022)    Exercise Vital Sign     Days of Exercise per Week: 4 days     Minutes of Exercise per Session: 30 min     On average, how many days per week do you engage in moderate to strenuous exercise (like a brisk walk)?: 4 days  Have you lost any weight without trying in the past 3 months?: No  Body mass index is 40.28 kg/m. (!) Abnormal  Obesity Interventions:  Patient declines any further evaluation or treatment       Hearing Screen:  Do you or your family notice any trouble with your hearing that hasn't been managed with hearing aids?: (!) Yes    Interventions:  Patient declines any further evaluation or treatment    Vision Screen:  Do you have difficulty driving, watching TV, or doing any of your daily  activities because of your eyesight?: (!) Yes  Have you had an eye exam within the past year?: Yes  No results found.    Interventions:   Patient encouraged to make appointment with their eye specialist    Safety:  Do all of your stairways have a railing or banister?: (!) No  Interventions:  Patient declined any further interventions or treatment    ADL's:   Patient reports needing help with:  Select all that apply: (!) Transportation  Interventions:  Patient declined any further interventions or treatment    Advanced Directives:  Do you have a Living Will?: (!) No    Intervention:  has NO advanced directive - information provided          Objective   Vitals:    01/15/22 1059   BP: 136/74   Site: Left Upper Arm   Position: Sitting   Cuff Size: Medium Adult   Pulse: 74   Resp: 20   Temp: 97.4 F (36.3 C)   TempSrc: Temporal   SpO2: 98%   Weight: 109.8 kg (242 lb 1 oz)   Height: 1.651 m (5\' 5" )      Body mass index is 40.28 kg/m.        Recent and Remote  memory are both intact. Words are recalled quickly and correctly.        Allergies   Allergen Reactions    Penicillins Swelling     Closes airway    Codeine      Prior to Visit Medications    Medication Sig Taking? Authorizing Provider   ondansetron (ZOFRAN-ODT) 4 MG disintegrating tablet  Yes [provider]   losartan (COZAAR) 100 MG tablet  Yes [provider]   amLODIPine (NORVASC) 5 MG tablet  Yes [provider]   busPIRone (BUSPAR) 5 MG tablet Take 1 tablet by mouth 3 times daily Yes [provider]   potassium chloride (MICRO-K) 10 MEQ extended release capsule Take 1 capsule by mouth 2 times daily Yes [provider]   promethazine (PHENERGAN) 25 MG tablet TAKE ONE TABLET BY MOUTH EVERY 6 HOURS AS NEEDED FOR NAUSEA & VOMITING Yes [provider]   meclizine (ANTIVERT) 25 MG tablet Take 1 tablet by mouth 3 times daily as needed Yes [provider]   MISC NATURAL PRODUCT OP Apply to eye Divertigo  Topical Oil for Vertigo Yes [provider]   bumetanide (BUMEX) 0.5 MG tablet  Yes [provider]   hyoscyamine (LEVSIN/SL) 125 MCG sublingual tablet  Yes [provider]   loratadine (CLARITIN) 10 MG tablet  Yes [provider]   pantoprazole (PROTONIX) 40 MG tablet Take 1 tablet by mouth 2 times daily Yes [provider]   albuterol sulfate HFA (PROVENTIL;VENTOLIN;PROAIR) 108 (90 Base) MCG/ACT inhaler Inhale 2 puffs into the lungs every 4 hours as needed Yes [provider]   fluticasone (FLONASE) 50 MCG/ACT nasal spray 2 sprays by Nasal route daily Yes [provider]   diazePAM (VALIUM) 2 MG tablet Take 1 tablet by mouth every 8 hours as needed. Yes [provider]   MISC NATURAL PRODUCT OP Take by mouth CuraMed 750MG  Yes [provider]   MISC NATURAL PRODUCT OP Take by mouth Curemend 100MG  Yes [provider]   diclofenac sodium (VOLTAREN) 1 % GEL Apply topically 2 times daily Yes , MD       CareTeam (Including outside providers/suppliers regularly involved in providing care):   Patient Care Team:  , MD as PCP - General (Family Medicine)     Reviewed and updated this visit:  Tobacco  Allergies  Meds  Med Hx  Surg Hx  Soc Hx  Fam Hx        Lab Work is done today per Dr. Jerel Shepherd' orders. Health Maintenance was reviewed and updated. She does not want a mammogram or Bone Density scheduled.     Jerel Shepherd, LPN, Carlynn Purl, performed the documented evaluation under the direct supervision of the attending physician.

## 2022-01-15 NOTE — Progress Notes (Signed)
SUBJECTIVE:    Patient ID: Margaret Hopkins is a 71 y.o.female.    HPI:   Patient here to establish care  Patient is a 71 year old female that used to be seen by [facility].  She have to be seen for by [place] because of insurance problems.  She have history of hypertension.  Take blood pressure medication.  Blood pressure is well-controlled.  Denies medication side effect.  She may have some renal insufficiency.  She said that it was mild.  She is due for blood work.  She also have history of vertigo after traumatic injury to her face.  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  Since then she has been taking BuSpar, Valium and meclizine to help with vertigo.  That regimen have seemed to be effective.  She have good days and bad days.  She have acid reflux and take a PPI with good results.  Denies medication side effect.  She have allergies and take antihistamines with good results.  She also have arthritis of her knees.  Knee arthritic changes are bilateral.  She is not ready for surgery.  She used braces and then use a walker to ambulate.  She is able to do what she wants with that regimen.  Does not want to do anything more aggressive than that.  Never tried Voltaren gel.  Health maintenance  Hep C screen.  Cologuard since refused colonoscopy.  Refused mammography         Past Medical History:   Diagnosis Date    Asthma     GERD (gastroesophageal reflux disease)     Hypothyroidism     Osteoarthritis     Vertigo      Current Outpatient Medications on File Prior to Visit   Medication Sig Dispense Refill    ondansetron (ZOFRAN-ODT) 4 MG disintegrating tablet       losartan (COZAAR) 100 MG tablet       amLODIPine (NORVASC) 5 MG tablet       busPIRone (BUSPAR) 5 MG tablet Take 1 tablet by mouth 3 times daily      potassium chloride (MICRO-K) 10 MEQ extended release capsule Take 1 capsule by mouth 2 times daily      promethazine (PHENERGAN) 25  MG tablet TAKE ONE TABLET BY MOUTH EVERY 6 HOURS AS NEEDED FOR NAUSEA & VOMITING      meclizine (ANTIVERT) 25 MG tablet Take 1 tablet by mouth 3 times daily as needed      MISC NATURAL PRODUCT OP Apply to eye Divertigo Topical Oil for Vertigo      bumetanide (BUMEX) 0.5 MG tablet       hyoscyamine (LEVSIN/SL) 125 MCG sublingual tablet       loratadine (CLARITIN) 10 MG tablet       pantoprazole (PROTONIX) 40 MG tablet Take 1 tablet by mouth 2 times daily      albuterol sulfate HFA (PROVENTIL;VENTOLIN;PROAIR) 108 (90 Base) MCG/ACT inhaler Inhale 2 puffs into the lungs every 4 hours as needed      fluticasone (FLONASE) 50 MCG/ACT nasal spray 2 sprays by Nasal route daily      diazePAM (VALIUM) 2 MG tablet Take 1 tablet by mouth every 8 hours as needed.      MISC NATURAL PRODUCT OP Take by mouth CuraMed 750MG       MISC NATURAL PRODUCT OP Take by mouth Curemend 100MG   No current facility-administered medications on file prior to visit.     Allergies   Allergen Reactions    Penicillins Swelling     Closes airway    Codeine      Past Surgical History:   Procedure Laterality Date    ANKLE SURGERY Left     FACIAL COSMETIC SURGERY      SALIVARY GLAND SURGERY       Family History   Problem Relation Age of Onset    Alzheimer's Disease Mother     Unknown Father      Social History     Socioeconomic History    Marital status: Single     Spouse name: Not on file    Number of children: Not on file    Years of education: Not on file    Highest education level: Not on file   Occupational History    Not on file   Tobacco Use    Smoking status: Never    Smokeless tobacco: Never   Substance and Sexual Activity    Alcohol use: Never    Drug use: Never    Sexual activity: Not Currently   Other Topics Concern    Not on file   Social History Narrative    Not on file     Social Determinants of Health     Financial Resource Strain: Not on file   Food Insecurity: Not on file   Transportation Needs: Not on file   Physical Activity: Not on  file   Stress: Not on file   Social Connections: Not on file   Intimate Partner Violence: Not on file   Housing Stability: Not on file        Review of Systems   Constitutional: Negative.    HENT: Negative.     Eyes: Negative.    Respiratory: Negative.     Cardiovascular: Negative.    Gastrointestinal: Negative.    Endocrine: Negative.    Genitourinary: Negative.    Musculoskeletal:  Positive for arthralgias.   Skin: Negative.    Allergic/Immunologic: Negative.    Neurological:  Positive for dizziness.   Hematological: Negative.    Psychiatric/Behavioral: Negative.         OBJECTIVE:    Physical Exam  Constitutional:       Appearance: Normal appearance. She is well-developed and well-groomed. She is obese.   HENT:      Head: Normocephalic and atraumatic.      Right Ear: Tympanic membrane, ear canal and external ear normal. There is no impacted cerumen.      Left Ear: Tympanic membrane, ear canal and external ear normal. There is no impacted cerumen.      Nose: Nose normal.      Mouth/Throat:      Lips: Pink.      Mouth: Mucous membranes are moist.      Dentition: Normal dentition.      Pharynx: Oropharynx is clear. Uvula midline.   Eyes:      General: Lids are normal.         Right eye: No discharge.         Left eye: No discharge.      Extraocular Movements: Extraocular movements intact.      Conjunctiva/sclera: Conjunctivae normal.      Right eye: Right conjunctiva is not injected.      Left eye: Left conjunctiva is not injected.      Pupils: Pupils are equal, round, and reactive  to light.   Neck:      Thyroid: No thyromegaly.      Vascular: No carotid bruit or JVD.   Cardiovascular:      Rate and Rhythm: Normal rate and regular rhythm.      Pulses: Normal pulses.           Carotid pulses are 2+ on the right side and 2+ on the left side.       Radial pulses are 2+ on the right side and 2+ on the left side.      Heart sounds: Normal heart sounds, S1 normal and S2 normal. No murmur heard.  Pulmonary:      Effort:  Pulmonary effort is normal.      Breath sounds: Normal breath sounds.   Abdominal:      General: Bowel sounds are normal. There is no distension or abdominal bruit.      Palpations: Abdomen is soft. There is no mass.      Hernia: No hernia is present.   Musculoskeletal:      Cervical back: Full passive range of motion without pain, normal range of motion and neck supple.      Right knee: Decreased range of motion. Tenderness present.      Left knee: Decreased range of motion. Tenderness present.      Right lower leg: No edema.      Left lower leg: No edema.      Comments: Ambulate with a walker   Lymphadenopathy:      Cervical: No cervical adenopathy.      Right cervical: No superficial cervical adenopathy.     Left cervical: No superficial cervical adenopathy.      Upper Body:      Right upper body: No supraclavicular adenopathy.      Left upper body: No supraclavicular adenopathy.   Skin:     General: Skin is warm and dry.      Coloration: Skin is not pale.      Findings: No lesion or rash.      Nails: There is no clubbing.   Neurological:      Mental Status: She is alert and oriented to person, place, and time.      Motor: No weakness or tremor.      Coordination: Coordination normal.      Deep Tendon Reflexes: Reflexes are normal and symmetric.   Psychiatric:         Attention and Perception: Attention normal.         Mood and Affect: Mood normal.         Speech: Speech normal.         Behavior: Behavior normal. Behavior is cooperative.         Thought Content: Thought content normal.         Cognition and Memory: Cognition and memory normal.         Judgment: Judgment normal.        BP 136/74 (Site: Left Upper Arm, Position: Sitting, Cuff Size: Medium Adult)   Pulse 74   Temp 97.4 F (36.3 C) (Temporal)   Ht 1.651 m (5\' 5" )   Wt 109.8 kg (242 lb)   SpO2 98%   BMI 40.27 kg/m      ASSESSMENT:     Diagnosis Orders   1. Encounter to establish care        2. Primary hypertension-stable CBC    Comprehensive  Metabolic Panel    Lipid  Panel    TSH    Urinalysis with Reflex to Culture      3. Vertigo-stable       4. Gastroesophageal reflux disease without esophagitis-stable       5. Allergic rhinitis, unspecified seasonality, unspecified trigger-stable       6. Primary osteoarthritis of both knees-stable diclofenac sodium (VOLTAREN) 1 % GEL      7. Need for hepatitis C screening test  Hepatitis C Antibody      8. Colon cancer screening  Fecal DNA Colorectal cancer screening (Cologuard)           PLAN:    1.  We will assume care  2.  Continue treatment plan and obtain blood work  3.  Zadie Rhine.  Continue same regiment since appears to be effective.  4.  Continue PPI  5.  Continue medication  6.  Trial of Voltaren gel  7.  Blood work  8.  Cologuard  Follow-up 1 to 2 months when she need a refill on Valium.

## 2022-01-16 ENCOUNTER — Encounter

## 2022-01-16 LAB — T4: T4, Total: 9.4 ug/dL (ref 4.5–11.7)

## 2022-01-16 MED ORDER — LOSARTAN POTASSIUM 100 MG PO TABS
100 MG | ORAL_TABLET | Freq: Every day | ORAL | 5 refills | Status: DC
Start: 2022-01-16 — End: 2022-01-19

## 2022-01-17 LAB — CULTURE, URINE: Urine Culture, Routine: NO GROWTH

## 2022-01-19 ENCOUNTER — Encounter

## 2022-01-19 MED ORDER — LOSARTAN POTASSIUM 100 MG PO TABS
100 MG | ORAL_TABLET | Freq: Every day | ORAL | 5 refills | Status: DC
Start: 2022-01-19 — End: 2022-06-09

## 2022-01-19 NOTE — Telephone Encounter (Signed)
-----   Message from Marzetta Board sent at 01/19/2022  9:31 AM CST -----  Subject: Refill Request    QUESTIONS  Name of Medication? losartan (COZAAR) 100 MG tablet  Patient-reported dosage and instructions? one daily  How many days do you have left? 4  Preferred Pharmacy? [facility] phone number (if available)? [phone]  ---------------------------------------------------------------------------  --------------  CALL BACK INFO  What is the best way for the office to contact you? OK to leave message on   voicemail  Preferred Call Back Phone Number? [phone]  ---------------------------------------------------------------------------  --------------  SCRIPT ANSWERS  Relationship to Patient? Self

## 2022-01-22 NOTE — Telephone Encounter (Signed)
Referred to ophthalmology.

## 2022-01-22 NOTE — Telephone Encounter (Signed)
-----   Message from Minus Breeding sent at 01/22/2022  2:11 PM CST -----  Subject: Message to Provider    QUESTIONS  Information for Provider? Patient phoned states that the OTC eye drops   (Clear Eye 5mg  sodium saline) is not helping for her dry eyes. Patient   would like PCP to send an RX for eye drops to [facility]   Pharmacy/[address]; please call patient when RX has been sent  ---------------------------------------------------------------------------  --------------  Kimberly  [phone]; OK to leave message on voicemail  ---------------------------------------------------------------------------  --------------  SCRIPT ANSWERS  Relationship to Patient? Self

## 2022-01-27 NOTE — Telephone Encounter (Signed)
My chart message sent to patient to offer Ophthalmology referral.

## 2022-02-10 NOTE — Telephone Encounter (Signed)
Left detailed message about offering ophthalmology referral

## 2022-03-11 ENCOUNTER — Telehealth

## 2022-03-11 NOTE — Telephone Encounter (Signed)
Ok

## 2022-03-11 NOTE — Telephone Encounter (Signed)
Called patient. She requested a heavy duty wheelchair Rx be sent to [facility]. She has pain in both of her knees and wears braces on her knees and also vertigo.     Please advise

## 2022-03-19 ENCOUNTER — Encounter: Payer: MEDICARE | Attending: Family Medicine | Primary: Family Medicine

## 2022-04-07 ENCOUNTER — Encounter: Payer: MEDICARE | Attending: Family | Primary: Family Medicine

## 2022-04-13 MED ORDER — HYOSCYAMINE SULFATE 0.125 MG SL SUBL
0.125 | ORAL_TABLET | Freq: Four times a day (QID) | SUBLINGUAL | 2 refills | 8.00000 days | Status: DC | PRN
Start: 2022-04-13 — End: 2023-08-31

## 2022-04-13 NOTE — Telephone Encounter (Signed)
Patient called stating the hycosamine 125 mcg she takes for her stomach cramping has two refills on it but the pharmacy says there is an issue with it.    It looks like you have not filled it, so the refills on probably under her previous physician. Please advise on filling this medication.

## 2022-04-21 ENCOUNTER — Encounter: Payer: MEDICARE | Attending: Family Medicine | Primary: Family Medicine

## 2022-05-19 ENCOUNTER — Encounter: Payer: MEDICARE | Attending: Family Medicine | Primary: Family Medicine

## 2022-06-09 ENCOUNTER — Encounter

## 2022-06-09 MED ORDER — LOSARTAN POTASSIUM 100 MG PO TABS
100 MG | ORAL_TABLET | Freq: Every day | ORAL | 1 refills | Status: DC
Start: 2022-06-09 — End: 2022-08-11

## 2022-06-09 NOTE — Telephone Encounter (Signed)
Called requesting 90 day supply on Losartan 100 mg daily to [facility].

## 2022-06-17 ENCOUNTER — Encounter: Payer: MEDICARE | Attending: Family Medicine | Primary: Family Medicine

## 2022-06-22 ENCOUNTER — Encounter: Payer: MEDICARE | Attending: Family Medicine | Primary: Family Medicine

## 2022-08-05 ENCOUNTER — Encounter: Payer: MEDICARE | Attending: Family Medicine | Primary: Family Medicine

## 2022-08-10 ENCOUNTER — Encounter

## 2022-08-11 MED ORDER — LOSARTAN POTASSIUM 100 MG PO TABS
100 MG | ORAL_TABLET | Freq: Every day | ORAL | 5 refills | Status: AC
Start: 2022-08-11 — End: 2023-03-08

## 2022-08-17 ENCOUNTER — Encounter: Payer: MEDICARE | Attending: Family Medicine | Primary: Family Medicine

## 2022-09-22 MED ORDER — PROMETHAZINE HCL 25 MG PO TABS
25 MG | ORAL_TABLET | ORAL | 1 refills | Status: AC
Start: 2022-09-22 — End: 2023-01-05

## 2022-09-22 MED ORDER — MECLIZINE HCL 25 MG PO TABS
25 MG | ORAL_TABLET | ORAL | 2 refills | Status: AC
Start: 2022-09-22 — End: 2023-03-08

## 2022-10-20 ENCOUNTER — Encounter: Payer: MEDICARE | Attending: Family Medicine | Primary: Family Medicine

## 2022-12-17 ENCOUNTER — Encounter: Payer: MEDICARE | Attending: Family Medicine | Primary: Family Medicine

## 2022-12-24 ENCOUNTER — Encounter: Payer: MEDICARE | Attending: Family Medicine | Primary: Family Medicine

## 2023-01-01 ENCOUNTER — Encounter: Payer: MEDICARE | Attending: Family Medicine | Primary: Family Medicine

## 2023-01-05 MED ORDER — PROMETHAZINE HCL 25 MG PO TABS
25 | ORAL_TABLET | ORAL | 1 refills | 5.00000 days | Status: DC
Start: 2023-01-05 — End: 2023-08-31

## 2023-01-19 ENCOUNTER — Telehealth: Admit: 2023-01-19 | Admitting: Family Medicine

## 2023-01-19 DIAGNOSIS — R42 Dizziness and giddiness: Secondary | ICD-10-CM

## 2023-01-19 MED ORDER — SCOPOLAMINE 1 MG/3DAYS TD PT72
1 MG/3DAYS | MEDICATED_PATCH | TRANSDERMAL | 0 refills | Status: DC
Start: 2023-01-19 — End: 2023-03-15

## 2023-01-19 NOTE — Telephone Encounter (Signed)
 Patient called stating she knows it has been a while since she has been in to see you. She is having really bad vertigo and she is asking for a rx for scopolamine patches to [facility].    Please advise.

## 2023-01-19 NOTE — Telephone Encounter (Signed)
 Okay.  But she need to have an appointment for blood work has been a while since she have any kind testing

## 2023-03-03 ENCOUNTER — Encounter: Payer: MEDICARE | Attending: Internal Medicine | Primary: Family Medicine

## 2023-03-06 ENCOUNTER — Encounter

## 2023-03-08 MED ORDER — MECLIZINE HCL 25 MG PO TABS
25 | ORAL_TABLET | Freq: Three times a day (TID) | ORAL | 0 refills | Status: DC | PRN
Start: 2023-03-08 — End: 2023-08-31

## 2023-03-08 MED ORDER — LOSARTAN POTASSIUM 100 MG PO TABS
100 | ORAL_TABLET | Freq: Every day | ORAL | 0 refills | Status: DC
Start: 2023-03-08 — End: 2023-08-31

## 2023-03-08 NOTE — Telephone Encounter (Signed)
One monthly only. Patient needs an appointment.

## 2023-03-15 ENCOUNTER — Telehealth

## 2023-03-15 MED ORDER — MECLIZINE HCL 25 MG PO TABS
25 | ORAL_TABLET | Freq: Three times a day (TID) | ORAL | 0 refills | Status: AC | PRN
Start: 2023-03-15 — End: 2023-03-25

## 2023-03-15 NOTE — Telephone Encounter (Signed)
Called pt advised of medication sent into pharmacy. Pt aware.

## 2023-03-15 NOTE — Telephone Encounter (Signed)
Patient has not been seen since November of 2023. She will need to be seen before we can send anything in besides meclizine.  If that does not help her dizziness and she is unable to get to an appointment because of the dizziness, she may need to go to the ER for a work up if it is that severe.

## 2023-03-15 NOTE — Telephone Encounter (Signed)
Scopolamine patch are that  expensive?  Call the pharmacy and see if they have a different option.  She can have meclizine 25 mg 3 times daily as needed if not need to be seen

## 2023-03-15 NOTE — Telephone Encounter (Signed)
Patient states she is so dizzy she can't come to her appointment tomorrow to establish care with Dr. Beverely Pace. She has moved her appointment to next month.    She says the patches were over $250 and she needs something to help with her dizziness. Please advise.

## 2023-03-16 ENCOUNTER — Encounter: Payer: MEDICARE | Attending: Internal Medicine | Primary: Family Medicine

## 2023-04-14 ENCOUNTER — Encounter: Payer: MEDICARE | Attending: Internal Medicine | Primary: Family Medicine

## 2023-05-11 NOTE — Telephone Encounter (Signed)
-----   Message from Margaret Hopkins sent at 05/11/2023 11:19 AM CDT -----  Regarding: ECC Escalation To Practice  ECC Escalation To Practice      Type of Escalation: Red Flag Symptom  --------------------------------------------------------------------------------------------------------------------------    Information for Provider:  Patient is looking for appointment for: Symptom - vertigo  Reasons for Message: Unable to reach practice     Additional Information: patient actually supposed to have an appointment tomorrow but needs to reschedule because she does not have transportation. Her new appointment is on Jun 24, 2023. However, she also mentioned that she is having vertigo. She also needs refill for acid reflux and blood pressure medication.  --------------------------------------------------------------------------------------------------------------------------    Relationship to Patient: Self  Call Back Info: OK to leave message on voicemail  Preferred Call Back Number: Phone [phone]

## 2023-05-13 ENCOUNTER — Encounter: Payer: MEDICARE | Attending: Internal Medicine | Primary: Family Medicine

## 2023-06-24 ENCOUNTER — Encounter: Payer: Medicare (Managed Care) | Attending: Internal Medicine | Primary: Family Medicine

## 2023-07-28 ENCOUNTER — Encounter: Payer: Medicare (Managed Care) | Attending: Internal Medicine | Primary: Family Medicine

## 2023-08-02 ENCOUNTER — Encounter: Payer: Medicare (Managed Care) | Attending: Internal Medicine | Primary: Family Medicine

## 2023-08-31 ENCOUNTER — Ambulatory Visit
Admit: 2023-08-31 | Discharge: 2023-08-31 | Payer: Medicare (Managed Care) | Attending: Internal Medicine | Primary: Internal Medicine

## 2023-08-31 ENCOUNTER — Encounter

## 2023-08-31 VITALS — BP 132/74 | HR 78 | Temp 97.80000°F | Ht 65.0 in

## 2023-08-31 DIAGNOSIS — Z6841 Body Mass Index (BMI) 40.0 and over, adult: Principal | ICD-10-CM

## 2023-08-31 LAB — COMPREHENSIVE METABOLIC PANEL
ALT: 12 U/L (ref 10–35)
AST: 17 U/L (ref 10–35)
Albumin: 4 g/dL (ref 3.5–5.2)
Alkaline Phosphatase: 72 U/L (ref 35–104)
Anion Gap: 10 mmol/L (ref 8–16)
BUN: 22 mg/dL (ref 8–23)
CO2: 27 mmol/L (ref 22–29)
Calcium: 9.7 mg/dL (ref 8.8–10.2)
Chloride: 104 mmol/L (ref 98–107)
Creatinine: 0.8 mg/dL (ref 0.5–0.9)
Est, Glom Filt Rate: 77 (ref >60)
Glucose: 100 mg/dL — ABNORMAL HIGH (ref 70–99)
Potassium: 4 mmol/L (ref 3.5–5.1)
Sodium: 141 mmol/L (ref 136–145)
Total Bilirubin: 0.4 mg/dL (ref 0.2–1.2)
Total Protein: 7.1 g/dL (ref 6.4–8.3)

## 2023-08-31 LAB — T4, FREE: T4 Free: 1.06 ng/dL (ref 0.93–1.70)

## 2023-08-31 LAB — CBC WITH AUTO DIFFERENTIAL
Basophils %: 0.7 % (ref 0.0–1.0)
Basophils Absolute: 0.1 K/uL (ref 0.00–0.20)
Eosinophils %: 5.9 % — ABNORMAL HIGH (ref 0.0–5.0)
Eosinophils Absolute: 0.4 K/uL (ref 0.00–0.60)
Hematocrit: 40.2 % (ref 37.0–47.0)
Hemoglobin: 13.4 g/dL (ref 12.0–16.0)
Immature Granulocytes #: 0 K/uL
Lymphocytes %: 21.3 % (ref 20.0–40.0)
Lymphocytes Absolute: 1.4 K/uL (ref 1.1–4.5)
MCH: 30.6 pg (ref 27.0–31.0)
MCHC: 33.3 g/dL (ref 33.0–37.0)
MCV: 91.8 fL (ref 81.0–99.0)
MPV: 9.9 fL (ref 9.4–12.3)
Monocytes %: 7 % (ref 0.0–10.0)
Monocytes Absolute: 0.5 K/uL (ref 0.00–0.90)
Neutrophils %: 64.7 % (ref 50.0–65.0)
Neutrophils Absolute: 4.4 K/uL (ref 1.5–7.5)
Platelets: 189 K/uL (ref 130–400)
RBC: 4.38 M/uL (ref 4.20–5.40)
RDW: 12.6 % (ref 11.5–14.5)
WBC: 6.8 K/uL (ref 4.8–10.8)

## 2023-08-31 LAB — TSH: TSH: 0.19 u[IU]/mL — ABNORMAL LOW (ref 0.270–4.200)

## 2023-08-31 LAB — ALBUMIN/CREATININE RATIO, URINE
Albumin Urine: 30 mg/dL — ABNORMAL HIGH (ref 0.00–1.99)
Albumin/Creatinine Ratio: 314.5 mg/g — ABNORMAL HIGH (ref 0.0–29.0)
Creatinine, Ur: 95.4 mg/dL (ref 28.0–217.0)

## 2023-08-31 LAB — RHEUMATOID FACTOR: Rheumatoid Factor: <10 [IU]/mL (ref <14)

## 2023-08-31 LAB — LIPID PANEL
Cholesterol, Total: 192 mg/dL (ref 0–199)
HDL: 58 mg/dL (ref 40–60)
LDL Cholesterol: 116 mg/dL — ABNORMAL HIGH (ref <100)
Triglycerides: 89 mg/dL (ref 0–149)

## 2023-08-31 LAB — C-REACTIVE PROTEIN: CRP: 6.2 mg/L — ABNORMAL HIGH (ref 0.0–5.0)

## 2023-08-31 LAB — SEDIMENTATION RATE: Sed Rate, Automated: 28 mm/h — ABNORMAL HIGH (ref 0–25)

## 2023-08-31 LAB — HEMOGLOBIN A1C: Hemoglobin A1C: 5.5 % (ref 4.0–5.6)

## 2023-08-31 MED ORDER — LOSARTAN POTASSIUM 100 MG PO TABS
100 | ORAL_TABLET | Freq: Every day | ORAL | 3 refills | 90.00000 days | Status: DC
Start: 2023-08-31 — End: 2023-12-15

## 2023-08-31 MED ORDER — CETIRIZINE HCL 10 MG PO TABS
10 | ORAL_TABLET | Freq: Every day | ORAL | 3 refills | 30.00000 days | Status: DC
Start: 2023-08-31 — End: 2023-12-15

## 2023-08-31 MED ORDER — ALBUTEROL SULFATE HFA 108 (90 BASE) MCG/ACT IN AERS
108 | RESPIRATORY_TRACT | 3 refills | 25.00000 days | Status: DC | PRN
Start: 2023-08-31 — End: 2023-12-15

## 2023-08-31 MED ORDER — FUROSEMIDE 20 MG PO TABS
20 | ORAL_TABLET | Freq: Every day | ORAL | 0 refills | 45.00000 days | Status: AC | PRN
Start: 2023-08-31 — End: ?

## 2023-08-31 MED ORDER — MECLIZINE HCL 25 MG PO TABS
25 | ORAL_TABLET | Freq: Three times a day (TID) | ORAL | 3 refills | 10.00000 days | Status: DC | PRN
Start: 2023-08-31 — End: 2023-12-15

## 2023-08-31 MED ORDER — PANTOPRAZOLE SODIUM 40 MG PO TBEC
40 | ORAL_TABLET | Freq: Two times a day (BID) | ORAL | 1 refills | 30.00000 days | Status: DC
Start: 2023-08-31 — End: 2023-08-31

## 2023-08-31 MED ORDER — PANTOPRAZOLE SODIUM 40 MG PO TBEC
40 | ORAL_TABLET | Freq: Every day | ORAL | 1 refills | 30.00000 days | Status: DC
Start: 2023-08-31 — End: 2023-10-12
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
## 2023-08-31 NOTE — Patient Instructions (Signed)
 [place]   Mental Health Resources*    Crisis Resources  988 Suicide and Crisis Hotline  If you or someone you know needs support now, call or text 988 or chat 988lifeline.org    Suicide Prevention Lifeline  1-800-273-TALK(8255)    Crisis Text Link  Text [place] to (952)638-9023    [facility]  Emergency Crisis Intervention Line   [phone]    Mental Health Providers     [facility]   Child Watch Counseling [facility]   [address] [place] [address]   [email or web address]   [phone]   Trauma (Child/Adolescent/Teen)  Patients under 2 years old accepted   Free Service  [place]   [address] [place], [place] [address] Suite [phone]   [phone]   Individual, couple, family counseling, children's counseling, depression, anxiety etc.  Patients under 20 years old accepted   Accepts Medicaid, Medicare, most Commercial Insurances  [facility]   [address]  [place] [address]   [email or web address]   [phone]   Patients under 66 years old accepted   Accepts Medicaid, Multiple Commercial   Dr. Servando [facility]   [address] [place], [place] [address]   [email or web address]   [phone]   Patients under 13 years old accepted    Accepts Most Smithfield Foods, [place] Medicaid, Medicare  [facility] [place] locations  [email or web address].[email or web address]   Patients under 73 years old accepted    Accepts Medicaid, Medicare, Most Private Insurances  [facility]  [address]; [place], [place]  [address]  [phone] option 6  [facility]  [address]; [place], [place]  [address]  [phone] option 5  [facility]  [address] [place], [place] [address]  [phone] option 7  [facility], Outpatient Behavioral Health   [address] [place]  [address]   [email or web address]   [phone]   Patients under 33 years old accepted   Accepts Medicaid, Medicare, and [facility]   [facility]   [address] [place] [address]   [email or web address]   [phone]   Group Therapy, Individual Therapy, EMDR, Sand Tray Therapy   Patients under 104 years old accepted   Self Pay- offers a sliding scale fee  [facility])   [address] [place] [address]   [email or web address]   [phone]   Walk-ins welcome Monday-Friday 8:00AM-5:00PM   Early Onset Psychosis, Youth Drop in Center, Specialized for children and substance abuse  Patients under 23 years old accepted   Accepts Medicaid, Medicare, Private Insurances  [facility]   [address] [place] [address]   [email or web address]   [phone]   Walk-ins welcome Monday-Friday 8:00AM-5:00PM (Main Office)   [facility] in [place] staffed 24 hours   Patients under 27 years old accepted   Accepts Medicaid, Medicare, Programmer, multimedia Psychological Services   [address] [place] [address]   www.[email or web address]   [phone]   Walk-ins welcome Monday-Friday 10:00am-6:00pm   Trauma Therapy, EMDR, Group Therapy   Patients under 39 years old accepted   Private Pay  [facility], [place], [place] [address]   [email or web address]   [phone]   Patients under 85 years old accepted   Accepts Optum, Occidental Petroleum, [address], Geophysicist/field seismologist, [place], Engineer, drilling, Atena  [facility]  [email or web address]    [phone]   General Behavioral Health:  Medication Assisted Treatment for Opioid Use Disorders; MRT   Patients under 71 years old accepted   Accepts Medicaid; Medicare; Media planner; Sliding Scale  [place]  Care - [facility]  [address] [place]  [address]  Phone: [phone]  [place]  Care - [facility])  [address] [place],  [place]  [address]  Phone: [phone]  [place]   [address] [place], [place] [address]   [email or web address]   [phone]   Trauma-focused therapy for survivors of sexual violence & child abuse   Patients under 36 years old accepted   Services available at no cost  [facility]   [address] [place] [address]   [email or web address]   [phone] Option 3   Accepts Medicaid, Medicare, Private Insurances  [facility], Inpatient   [address] [place] [address]   www.[email or web address]   [phone]   Inpatient Unit is 24/7   Accepts Medicaid, Medicare, Private [facility]   [address] [place], [place] [address]   [email or web address]   [phone]   [facility] for kids and families, counseling, psychiatric with medication management, speech and language pathology, occupational therapy  Patients under 59 years old accepted  Sliding Scale Available   [facility]   [address] [place], [place] [address] [facility]) [email or web address]   [phone] ext. [phone]   EMDR    Patients under 64 years old accepted   Accepts Anthem, Humana, Affiliated Computer Services, (No Medicaid or Medicare)  [facility] [place]   [address] [place], [place] [address]   [email or web address]  [phone]  Accepts most Commercial Insurances, [facility]   [address] [place], [place] [address]   [email or web address]   [phone]   NeuroStar TMS,  Ketamine Therapy and Spravato   Patients under 66 years old accepted  All insurances accepted   [facility]   [address], [place] [address]   [email or web address]  [phone]   Monday, Tuesday, Wednesday, Friday 1:00PM-6:00PM (Ages 16-25) Thursday 1:00PM-6:00PM (Ages 33-15)   Patients under 68 years old accepted   Free services  [facility]   [address] [place] [address]   [email or web address]   [phone]   Daily 8:00AM-8:00PM, Walk Ins Welcome  Free    [facility]   [place]  Care - [facility]  [address], [place], [place]  [address]  Phone: [phone]  [email or web address]    [phone]   General Behavioral Health:  Medication Assisted Treatment for Opioid Use Disorders; MRT   Patients under 23 years old accepted   Accepts Medicaid; Medicare; Media planner; Sliding Scale    [facility]   [address], [place] [address]   [email or web address]   [phone]   Walk In Available on Friday  [facility]  [facility]  [address]; [place], [place] [address]  [phone] option 8  [email or web address]   [phone]  Patients under 61 years old accepted    Accepts Medicaid, Medicare, Most Private Insurances  [place]  Care - [facility]  [address] [place]  [address]  Phone: [phone]  [email or web address]    [phone]   General Behavioral Health:  Medication Assisted Treatment for Opioid Use Disorders; MRT   Patients under 18  years old accepted   Accepts Medicaid; Medicare; Private Insurance; Sliding Scale  [facility]   [address], [place], [place] [address]   [email or web address]   [phone]   Individual therapy for children and adults, couples and family therapy, and evaluations for learning disabilities and ADHD. Walk-ins accepted  Patients under 12 years old accepted  Income based sliding fee scale    [facility]   [place]  Care - [place] [facility]  [address], [place], [place]  [address]  Phone: [phone]  [email or web address]    [phone]   General Behavioral Health:  Medication Assisted Treatment for Opioid Use Disorders; MRT   Patients under 14 years old accepted   Accepts Medicaid; Medicare; Private Insurance; Sliding  Scale    [facility] [place]  [address]; [place], [place] [address]  [phone] option 9  [email or web address]   [phone]  Patients under 58 years old accepted    Accepts Medicaid, Medicare, Most Private Insurances  [place]  [facility]  [address], [place], [place]  [address]  Phone: [phone]  www.[email or web address]    [phone]   General Behavioral Health:  Medication Assisted Treatment for Opioid Use Disorders; MRT   Patients under 51 years old accepted   Accepts Medicaid; Medicare; Media planner; Sliding Scale  [facility]   [phone] [address] [place], [address]   [email or web address]   [phone]   8:00 am- 4:30 pm Walk Ins Oncologist for Children, H[employer]el Foods, Motivational Interviewing, Family Functional Therapy, Trauma Focused Cognitive Behavior Therapy, Parent Child Interaction Therapy  Patients under 70 years old accepted  DCBS/Cabinet Referrals only     [facility]   [place]  Care - [facility]  [address] [place]  [address], [place], [place]  [address]  Phone: [phone]  [email or web address]    [phone]   General Behavioral Health:  Medication Assisted Treatment for Opioid Use Disorders; MRT   Patients under 16 years old accepted   Accepts Medicaid; Medicare; Media planner; Sliding Scale    [facility]   [address] [place], [place]  [address]   [email or web address] [phone]   Outpatient Behavioral Health   Accepts Medicare Part B Primary/Most Secondary  Kaitlin Loveless   Locations in [place], [place],  and [facility]    [facility] - Find a Provider ([email or web address])   [phone]   Patients under 74 years old accepted   Accepts a majority of Commercial, Medicare, and Medicaid    [facility]   A New Day Counseling Service, [facility]   [address] [place]  [address]   [phone]   Substance Abuse Counseling   Private Pay  [facility]   [address] [place] [address]   [email or web address]   [phone]   Crisis Trauma, EMDR, Addiction  Patients under 32 years old accepted   Services offered at reduced cost  [facility]   [address] [place], [address]   [email or web address]   [phone] Trauma Treatment Specialist, with a focus on Holistic Psychotherapy including Cellular Release Therapy, and Hypnosis for Healing   Patients under 39 years old accepted    Sliding Scale Available  [facility] [address] Counseling, [facility]   [address] [place] [address]  [email or web address]   [phone] Fridays 9:00AM-5:00PM   Trauma Counseling (Child/Adolescent/Teen/Adult)); Biblical Counseling (All services with  a Firefighter)   Patients under 81 years old accepted   Private Pay (At this time)  [facility]   [address], [place], [place]  [address]   [email or web address]   [phone] (REST)   Trauma counseling for crime victims   Patients under 27 years old accepted   Services are [facility]  [facility]   [address] [place] [address]   [email or web address]   [phone]   Patients under 42 years old accepted  Accepts Medicaid, Private Insurances  [facility]   [address] [place] [address]   www.[email or web address]   [phone]   Accepts Medicaid  [facility]   [address], [place], [place] [address]   [phone] (Can Text)   By Appointment Only   Patients under 81 years old accepted  Private Pay    Online / Telehealth Only  [facility]   [id number] [address] [place], [place] [address]   [email or web address]   [phone]   Online mental health services such as Counseling, Psychiatry, Case Management, and Crisis Support    Patients under 73 years old accepted   Accepts Medicaid, Medicare, Most Major Insurance Plans       [employer] [place]   Transportation Resources*  (Call 211/United Way for additional resources)    Public Transportation  [place] Area Transit System (PATS) - [place], [place], [place]ra [place], and [facility]  [address] [place], [place]  [address]  [phone] -or- Hearing impaired: [phone]   Monday - Saturday 5 AM - 6 PM  PublicAttractions.de  PATS offers an American with Disabilities Act (ADA) transport program for a flat rate for each direction of travel. Please ask the PATS Operator about the program.    [facility] - [place] Baron, and [place]  [address], [place], [place] [address]  [phone]  Monday - Friday 7AM - 4PM  [email or web address]    [place] Transit Authority - [facility]  [address] [place], [place] [address]  Phone [phone], Monday - Friday 7AM - 5 PM  Phone [phone], Friday 5:30 PM -10:30 PM & Saturday 9 AM - 9PM  DominoRoom.[email or web address]    [place] Area Temple-Inland (PACS) - [place], [place], [place], [place], [place], [place], [place], [place], and [place]  [address], [place], [place] [address]  [phone]  [email or web address]://www.pacs-[email or web address]    [facility] Transit - [place]  [place]ies  [address], [place] [address]  [phone]  MartiniMobile.it    Public Transportation provided by [place]  Medicaid  [place] Intra-county Transportation (The TJX Companies) - Medicaid-broker for Harley[employer]son for [place], [place], [place], [place], [place], [place], [place], and [place] counties   https://www.audubon-[email or web address]/transportation.html  [address] [place] [address]  [place], [place]  [address]  [phone]    The [facility] Intra-county Transit System (GRITS) provides clean, safe, and reliable public transportation at no or low cost for  patients who are eligible for [place]  Medicaid. Medicaid eligible clients without vehicles are provided transportation to approved Medicaid appointments without cost. You will need to schedule your trip at 72 hours in advance!   If you are DENIED GRITS transportation and need more information on this, please contact the Office of Transportation Delivery [phone]    Occidental Petroleum Service Education officer, museum) - Medicaid-broker for Harley[employer]son for [place], [place], [place], [place], [place], [place], [place], [place], and [place] counties   http://www.pacs-[email or web address]/Transportation   [address]   [place], [place] [address]  [phone]    [place] Area  Water engineer) provides clean, safe, and reliable public transportation at no or low cost for patients who are eligible for [place]  Medicaid. Medicaid eligible clients without vehicles are provided transportation to approved Medicaid appointments without cost. You will need to schedule your trip at 72 hours in advance.   If you are DENIED GRITS transportation and need more information on this, please contact the Office of Transportation Delivery [phone]     Transportation Assistance through Health Plans  To better understand if your health insurance offers transportation assistance, you can contact a Occupational psychologist for your plan by calling the Customer Service number on the back of your insurance card. Ask the Representative if your health care plan provides transportation assistance to health care appointments.    [place]  Managed-Care Organization [facility]) Medicaid Recipients:  The Commonwealth offers free transportation to its [facility] Medicaid Recipients who are qualified to ride. transportation is only for Health Care Appointments, Testing, Procedures, and/or Hospital/ER discharge. Service or treatment patient will be receiving has to be billable to [place]  Medicaid or transportation is not provided.    PURCHASE AREA - [place], [place], [place], [place], [place], and [place] counties   GRITS linn Centers Area [employer]co Health Solutions) [phone] or [phone] Monday - Friday 8 AM - 4:30 PM Saturday 8 AM - 1 PM  [place] AREA - [place], [place], [place], [place], [place], [place], [place], [place], and Saks Incorporated (PACS) [phone] Monday - Friday 8 AM - 4:30 PM Saturday 8 AM - 1 PM    Medicare Advantage Beneficiaries:   If you have a Medicare Advantage Health Care plan, you can contact a Customer Service Representative for your Medicare Advantage plan by calling the Customer Service number on the back of your insurance card. Ask the Representative if your health care plan provides transportation assistance to health care appointments.    [address] [place], [place])   [phone]  [place], [place])  [phone]  Edra Melnick [place], [place])  [phone]  Fjfj Wjwrb'd Cabs [place], [place])   [phone]  LYFT [email or web address]  UBER [email or web address]    JPMorgan Chase & Co  https://[email or web address]   [phone] -or- [phone]  [address] [place], [place] [address]    Other Transportation Resources   [facility]   [address], [place], [place] [address]  [email or web address].com.[email or web address]  Request Assistance for transportation on the ground with gas cards, bus or train tickets or in the air with flights flown by General Motors or the commercial airlines.    [facility]  If you are established with a [facility] primary care provider, [facility] will provide transportation to any appointments, including follow up appointments to specialists and other referrals from primary care, as needed  www. [email or web address].net     [phone]     [facility] Transport     [address], [facility], [place] [address]    [phone]    We are a Non-Emergency Medical Transportation Company. We transport  clients to and from doctor's appointments, dialysis appointments, hospital stays, adult daycare and more. We treat every client like family and not like a number. We have wheelchair accessible vans for those who need wheelchair transportation. We also transport to hospitals and doctor's offices in [place], [place] and [place]. We accept Medicaid and private pay clients. (We are hoping to accept Medicare clients soon) Please give us  a call for more information.    Made To Stay- [facility]  GolfStylist.se  [phone]  Email:  [email or web address]  Mailing address:  Made to Stay, [address] [place] [address]    Membership-based service that can help with transportation amongst other support services.  Simply print the application from their website and send it back in via email or traditional mail. Can apply for membership if you are a: [facility] [place] [facility] resident and are 78 years of age or older -or- are an adult living in [place] with a disability (even a temporary one). Membership fee runs $360 per calendar year for one individual or $600 for a household. Made to Stay has limited memberships which help support those with short-terms needs- which helps to support transportation needs for those who cannot drive due to surgery and/or medication use. Contact them for details.     Road to Recovery by American Cancer Society   https://www.cancer.org/support-programs-and-services/road-to-recovery.html  ([phone]    The American Cancer Society Road To Recovery program provides free ground transportation to and from cancer-related medical appointments for people with cancer who do not have a ride or are unable to drive themselves. Depending on needs and what's available in their area, we may be able to coordinate a ride with an Oceanographer. *MUST HAVE A CANCER DIAGNOSIS TO QUALIFY*    [place] [facility]  https://[email or web address]/services  [phone]  [address], [place], [place] [address]    For homebound members of the [facility], transportation services are provided to assist with medical appointments, grocery store and pharmacy visits, and other general errands our members might need. We also offer transportation for your trips to the [facility]! To request transportation services, contact us  today.    If you live outside of [facility], Psychologist, occupational or Copy on Aging for transportation assistance options specific to your county:     Glass blower/designer on Aging and Independent Living   [place], [place], [place], [place], [place], [place], [place] and [place]  [phone]    Goldman Sachs on Aging and YRC Worldwide, [place], [place], [place], [place], [place], [place] and [place]  [phone]     Agricultural consultant for The PNC Financial by BlueLinx   BiographySeries.dk  [phone]. [phone]  DAV offers a wide range of support for our nation's heroes including transportation for veterans to and from their [place] medical appointments.     Surveyor, mining for Audiological scientist Needs by Lennar Corporation  SpoolDirect.uy  [phone] [address]  [place]  [address]  [phone]    LifeLine Pilots coordinate free flights for passengers who need to travel a long distance to non-emergency medical treatment. Eligible clients would live in rural communities, be compromised in a way that commercial flight would not be advised or suitable, and have limited income to access air transportation otherwise.     Badges of Hope  Any person that voluntarily requests help with substance addiction can be transported by a sheriff's deputy to a treatment center that is partnering with [employer]. The deputy will provide additional assistance in starting the program. Call the  [facility] Office at [phone] and ask for [facility].    Heart USA  Medical Transportation Program   This program is to be used for transportation to medical appointments only. At this time, it is preferred that the appointments be between 11am-2pm Monday through  Thursday, and that the patient is able-bodied and live within a 50 mile radius  of [place].  Patients must have exhausted all other transportation options and benefits. To schedule, call the dedicated phone line:  [phone] [facility]   Food Resources*  (Call 211/United Way for additional resources)    Boston Scientific (PADD)   [place], [place], [place], [place], [place], [place], [place] and [place]  Ph. [phone]  Contact PADD to find out more information on the Allied Waste Industries and commodities, get set up for Meals on Wheels, or get in contact with a Teacher, adult education.    [facility]  Typically serve a daily lunch during the week- contact for meal times  [employer] of [place]  [address] [place], [place] [address]  [phone]  Serving a free lunch from 11:00 AM to 1:00 PM Monday through Friday. Facility includes Showers, Statistician, and Engineer, site.  [facility]  [address] [place], [place]  [address]  [phone]  [facility]  [address] [place], [place]  [address]  [phone]  Serving a free lunch on Sundays  [facility] of Prayer  [address] [place], [place]  [address]  [phone]  [facility]  [address], [place], [place] [address]  [phone]  Serving a free lunch on Saturdays from [employer]   Typically serve a daily meal and serve as point of contact for Meals on Wheels program  [facility]  [address] [place], [place]   [phone]    Food Pantry  Food distribution. Most require person to bring  ID/documentation. Contact for information.    Family Service Society   [address] [place], [place]  [address]  [phone]  [facility]  [address] [place], [place]  [address]  [phone]  Shelvy Specking de Deward   [address] [place], [place]  [address]  [phone]  [facility] [place]  Allied Service  [address] [place], [place]  [address]  [phone]  Heart USA   [address] [place] [address]  https://heart-usa .com   [phone]    Blessing Box  Community supported Librarian, academic filled with non-perishables. Take what you need.   [facility]  [address] [place], [place]  [address] (by the bus stop)  [facility]  [address] [place], [place]  [address] (far left side of front parking lot)  [facility]   [address] [place], [place]  [address] (right side of front entrance)   [facility]   [address] [place], [place]  [address]  Seabron Ava Eartha Tommi Gretta Rusty & Lone [address], [place], [place]  [place] [address]   [address], [place], [place]  [address] of [place]   [address] & [place]  [place], [place], [address] of [place]    [address], [place], [place]  [address]   [address], [place], [address] of Nazarene   [address] [place], [place]  Project [place]   [address] [place], [place]    [facility]   Typically serve a daily meal and serve as point of contact for W. [employer]. [employer]kley on Wheels program  [facility]  [address] [place], [place]  [address]  [phone]    Food Pantry  Food distribution. Most require person to bring ID/documentation. Contact for information.    [address]  [facility] Food Pantry   [address] [place], [place]  [address]  [phone]  [facility] [place]  AK [employer] Corporation  [address] [place], [place]  [address]    [phone]    [facility]  Typically serve a daily lunch during the week- contact for meal times.  Soup for the Soul   [address] [place], [place]  [address]  [phone]  Serving a free take-out dinner from 4:00 PM through 5:30 PM Monday through Friday and a dine-in dinner Tuesday through Friday.    Senior Center   Typically serve a daily meal and serve as point of contact for Meals on Wheels program  [facility]  [address] [place], [place]  [address]  [phone]    Food Pantry  Food distribution. Most require person to bring ID/documentation. Contact for information.    [address] [place] of Christ   [address] [place], [place]  [address]  [phone]  [facility]  [address] [place], [place]  [address]  [phone]  [facility] [place]  Allied Service  [address] [place], [place]  [address]  [phone]    [facility]   Typically serve a daily meal and serve as point of contact for Meals on Wheels program  [facility]  [address] [place], [place]  [address]   [phone]    Food Pantry  Food distribution. Most require person to bring ID/documentation. Contact for information.    [facility]  [address] [place], [place]  [address]  [phone]  [facility] Food Pantry  [phone] [place]-[address] [place], [place]  [address]   [facility]  [address] [place], [place]  [address]  [phone]    [facility]  Typically serve a daily lunch during the week- contact for meal times.  [facility]   [address] [place], [place]  [address]  [phone]    Senior Center   Typically serve a daily meal and serve as point of contact for Meals on Wheels program  [facility]  [address] [place], [place]  [address]  [phone]    Food Pantry  Food distribution. Most require person to bring  ID/documentation. Contact for information.    [facility]  [address] [place], [place]  [address]  [phone]  First Assembly of God  [address] [place], [place]  [address]  [phone]  Operation Not 1 Missed   [address] [place], [place] [address]  [phone]  [address] [place], [place] [address]  [phone]  [place]ricks  Allied Service  [address] [place], [place] [address]  [phone]  Zelda Idol Foundation   [phone]  [facility]   [phone]    [facility]   Typically serve a daily meal and serve as point of contact for Meals on Wheels program  [facility]  [address], [place], [place] [address]  [phone]    Food Pantry  Food distribution. Most require person to bring ID/documentation. Contact for information.    [facility] Food Distribution Center  [address] [place], [place] [address]  [phone]  [place]  [facility]  [address] [place], [place] [address]  [phone]     [facility]  Typically serve a daily lunch during the week- contact for meal times.  [employer]ella[employer] Kitchen  [address] [place], [place]  [address]  [phone]  Serving a  free lunch from 11:00 AM to 1:00 PM Monday through Friday. Please call to see if meal delivery is an option.    Food Engineer, civil (consulting) distribution. Most require person to bring ID/documentation. Contact for information.    [facility] [place] [facility]   [address] [place], [place]  [address]  [phone]  [facility] [place] [facility]line  [address] [place], [place]  [address]  [phone]  Bags of [facility]  [address] [place], [place] [address]  [phone]  [facility]  [address] [place], [place] [address]  [phone]  [facility] [place]  Allied Service  [address] [place], [place]  [address]  [phone]    Blessing Box  Community supported Librarian, academic filled with non-perishables. Take  what you need.   [facility]   (located on walkway from patient parking to office entrance)  [address] [place], [place]  [address]Tzdu Locust   Housing Resources*  (Call 211/United Way for additional resources)    Emergency Shelter Assistance    [facility] "Fresh Start [place]" - for women and children.  [address], [place], [place]  [address]  Website: https://paducahcoopministry.org/emergency-shelter/  640-447-2654    [facility]- for single men or married couples and children.   [address] [place], [place]  [address]  Website: https://rivercitymission.org/  [phone]    Nonnie[address] Place- for women and children.  [place], [place]    Website: http://www.nonniesplaceeddyville.com/  331-404-0227    [facility] [place]ay"- for [facility] residents with custody of children  [address] [place], [place]  [address]  [phone]    St. [facility]'s- for single men  Website: FlyingExperts.dk  [address] [place], [place]  [address]  [phone]    [facility]- for women and children fleeing domestic violence  Website: https://merrymanhouse.org  [place], [place]    24-hour crisis line- 1.423-472-7293 -or- [phone]    [place]  Housing Corporation- Doctor, general practice with local resources   Resource Guide: CompanyReservations.it.pdf  [address] [place], [place]  [address]  [phone]    [place]  Adult Protection Services (APS)- You may call [place] APS to ask for help in finding shelter when you do not have other options, such as family or friends, or is the local shelters are full.   Local Phone [phone] (Open Monday through Friday 8:00am to 4:00pm)  Website: http://boyd-martinez.com/  24 Hour HOTLINE: 1.(320)061-4986    [place]  Housing Corporation  Website:  https://www.hud.gov/states/[place] /homeless/shelters#all   [address] [place], [place] [address]  [phone]  [phone]    [place]  [employer]- Emergency McDonald's Corporation - Emergency food, clothing, and housing programs. Contact your local office for assistance.  Website: https://www.wkas-ky.vickey [place]:   [address] [place], [place] [address]   [phone]  [facility]:   [address] [place], [place] [address]  [phone]  [place]:   [address] [place], [place] [address]  [phone]  [facility]:   [address] [place], [place] [address]  [phone]  [facility]:   [address] [place], [place] [address]  [phone]  Graves:   [address] [place], [place] [address]  [phone]  [place]:   [address] [place], [place] [address]  [phone]  [facility]:   [address] [place] [address]  [phone]    [facility] [place] [facility] Loc[facility] at [place]  [address]   [address] [place] [address]  [phone]  The Warming Center opens during the month of November and closes  towards the end of  March, and is only open on nights when the low temperature is 40 or below.  You are welcome to walk-in between 5:30pm to 10:00pm with expected next day departure between 6:30am to 8:30am. Capacity is 20 individuals. First come; first served. Pets are not welcome.    [address]   [address] [place], [place] [address]   [phone]   Offers rent-free temporary housing for those displaced by disaster or due to no fault of their own. Those seeking housing must apply in person and complete an application process which includes a background check and drug screen. If approved, residents will have up to 12 months of rent-free housing while they regroup, recuperate, or rebuild as they recover from the cause of their displacement.      General Land for subsidized apartments:  Subsidized apartments owners offer reduced rent to low-income  tenants. Contact or visit the management office of each apartment building that interests you.   Website: ClosetRepublicans.fi    Apply for JPM[employer] Chase & Co and Housing Choice Vouchers assistance:  Contact your local office for assistance.     Housing Authority of [place]   [address] [place], [place]  [address]  [phone]    Housing Authority of [place]  [address] [place], [place]  [address]  [phone]    Housing Authority of [place]  [address] [place], [place]  [address]  [phone]    Housing Authority of [place]  [address] [place], [place]    [phone]    Housing Authority of [place]  [address] [place], [place]  [address]  [phone]    Housing Authority of [place]  [address] [place], [place]  [address]  [phone]    Housing Authority of [place]   [address] [place], [place]  [address]  [phone]    Bieber of [place] Section 8 Housing Program  [address]  [address] [place], [place]  [address]  [phone]  [phone]  https://www.hud.gov/states/[place] /renting     Housing Authority of [place]  [address] [place], [place]  [address]  [phone]    Housing Authority of [place]  [address] [place], [place]  [address]  [phone]      Other Support     U.S. Department of HUD- [place] Supportive Housing- assistance for [employer], avoid foreclosure   [place] of [place] Section 8 Office   Website: https://www.hud.gov/states/[place] /homeless/helping_veterans/hud_vash  https://www.hud.gov/states/[place] /homeownership/foreclosure  (817)238-8783 or (416)182-4247    Family Service Society- rent assistance for The Mutual of Omaha residents only  [address] [place], [place]  [address]  Website: https://www.http://cook-little.org/  (920) 561-8456    [facility]  [address] [place], [place]  [address]  Website: https://paducahcoopministry.org/  9477480561    [facility] [place] [employer] Metals Company  [address], [place], [place]  [address]  Website: https://southernusa.salvationarmy.org/[place] or OregonTravelAgents.ch   7014574374    BJ's Wholesale- Serving free hot lunches and providing assistance with a variety of needs, including housing.  [address], [place], [place] [address]  Website: https://www.paducahcommunitykitchen.vickey   [phone]  Social Worker available on Tuesdays and Thursdays (Serving lunch 11:00am to 1:00pm Monday-Friday)    Housing and Winn[employer]Dixie of [place] - various programs to prevent eviction for [employer]o: TubeText.co.za  ([phone] ext 118  -or- ext 122    LivWell- assistance for individuals living with HIV/AIDS  [address] [place], [place]  [address]  Website: http://hiv.hcares.org/  205-317-3768    [facility]- If you are receiving (or in need  of receiving) behavioral health at [facility], you may request to work with a Case Manager who can assist with housing needs.  [address], [place]  [address]  Website: https://4rbh.org  6147334316 -or- Toll Free [phone]  Crisis Hotline: 1.(825)523-0573    [place]  Homeownership Protection Center- mortgage assistance to prevent foreclosure  Website: www.protectmykyhome.org  WorkDashboard.es.PDF  MouthDisorder.no   [phone]    [place] Supportive Housing Program- financial assistance and case management  Website: www.purchaseadd.org  (947)648-7320    [place]  Legal Aid- free legal assistance  Website: SocialSpecialists.co.nz  408-186-2528    Operation Hope- no cost Horticulturist, commercial and homeownership coaching program  [address] [place], [place]  [address]  Website: www.operationhope.vickey [place], [phone]    [place]  Social Service Department  Website: www.DomainVeteran.com.cy    General Information Phone [phone]  [place] Office: [phone]  [place] Office: [phone]    [place]  Department of Starbucks Corporation  "[place]" Region   [phone]    [facility]  Senior and Disability Apartments  [address], [place], [place] [address]  [phone]    [facility] [place]  [address] [place], [place] [address]  [phone] [place]    Utility - Financial Resources*  (Call 211/United Way for additional resources)    Utility:  Low Income Home Energy Assistance Program [place])  Federally-funded program to help eligible, low-income households meet their heating/cooling needs.   Website: http://black-clark.com/.aspx  1.442-606-7056      Crisis Resources    [facility]  [address] [place], [place]  [address]  [phone]  [facility] [place]  Allied Services   [address] [place]  [address]  Website: https://www.wkas-ky.org/  [phone]  [facility]   [address] [place], [place]  [address]  Website: https://www.http://cook-little.org/  (403)359-7490  [facility]  [address] [place], [place]  [address]  Website: https://paducahcoopministry.org/  9084645792  [facility]  [address], [place], [place] [address]   [phone]  [facility]  [address] [place], [place]  [address]  [phone]  [place]  [address] [facility]  [address] [place], [place]  [address]  [phone]    [facility]  [address] [place], [place]  [address]  [phone] or [phone]  [facility] [place]  Allied Services  [address] [place], [place]  [address]  Website: https://www.wkas-ky.org/  [phone]  [facility]  [address] [place], [place]  [address]  [phone]    [facility]  Allied Services  [address] [place], [place]  [address]  Website: https://www.wkas-ky.org/  [phone]  [facility] Support Office  [address], [place]  [address]  [phone]    [facility]  Allied  Services  [address] [place], [place]  [address]  Website: https://www.wkas-ky.org/  [phone]  [facility] Support Office  [address] [place], [place]  [address]  [phone]    [facility]   Allied Services  [address] [place], [place]  [address]  Website: https://www.wkas-ky.org/  9291402929  [facility] Support Office  [address] [place], [place]  [address]  [phone]     [facility] Support Office  [address]  [place], [place]  [address]  [phone]  Zelda Idol Foundation  Website: https://www.anniegardnerfoundation.org/  639-657-4392  [facility]  [address] [place], [place]  [address]  Website: https://www.wkas-ky.org/  617-660-0631  First Assembly of God   [address] [place], [place]  [address]  [phone]  [place] [facility] Support Office  [address] [place], [place]  [address]  [phone] or [phone]  [facility]  Allied Services  [address] [place], [place]  [address]   Website: https://www.wkas-ky.org/  3177710143  [facility] Support Office  [address] [place], [place]  [address]  [phone]    [facility] [place]  Allied Services  [address] [place], [place]  [address]  Website: https://www.wkas-ky.org/  606-714-3788  [facility] Support Office   [address] [place], [place]  [address]  [phone] or [phone]  [facility]line  [address] [place], [place]  [address]   [phone]  [facility]   [address] [place]  [address]  [phone]  [facility]  [address] [place], [place]  [address]  [phone]  [facility]  [address] [place], [place]  [address]   [phone]    Prescription Medication Assistance:   Heart USA   [address] [place], [place]  [address]  Website: https://heart-usa .com  (916)281-9749  [place]  Prescription Assistance Program  Website:  http://www.gallegos-johnson.biz/.aspx  1.(563) 618-0832  Huntsman Corporation Rx Program  Website: https://jacobson-moore.net/  [address] [place], [place]  [address]  (305) [phone]  GoodRx  Website: https://www.goodrx.com  2480812465  NeedyMeds  Website: https://www.needymeds.org/  1.4167617288  RxAssist  Website: https://www.clark.net/  216-840-1107  Clorox Company  Website: https://www.panfoundation.org  1.306-293-7215   Single Care  www.singlecare.com     6512672043  Optum Perks  www.perks.optum.com    7122187016  Many drug manufacturers offer prescription assistance programs on their website.    Assistance with Medical Expenses:  [facility] Financial Assistance [phone]  What they offer: The [facility] Financial Assistance Program helps uninsured patients who do not qualify for government-sponsored health insurance and cannot afford to pay for their medical care. Insured patients may also qualify for assistance based on family income, family size, and medical needs.     How to apply for the [facility] (FAP):  Option 1: To apply for financial assistance, a patient (or their family or other provider) should fill out the Software engineer. Copies of the Financial Assistance Application and the FAP may be obtained for free by calling the [facility] Customer Service Department at [phone].  Option 2: The Software engineer and policy may be obtained for free by downloading a copy from the [facility] website:   https://www.Center Line.com/patient-resources/financial-assistance     [facility] Financial Counselor [phone]  [address] [place] [address]    Family Service Society [phone]  [address] [place] [address]  9 AM - 3:30?PM   Monday - Friday    [facility] [place]  Allied Services [phone]  [address] [place] [address]  8 AM - 4:30?PM Monday -  Friday    [facility] Development [phone]  [address] [place], [place] [address]    [facility] Unlimited  Educates and supports moms and dads through the journey of pregnancy and parenting.  Website: FindDrives.pl  [address] [place], [place]  [address]  [phone]    [facility]  Speak with a local KYNECTOR to see if you qualify for [facility] primary or secondary coverage.  Free help with your benefit application is available through a KYNECTOR.   Website: WithoutCable.hu    Dollar For  Liberty Global to assist with financial assistance programs at outside hospitals  Website: https://dollarfor.org/    Garment/textile technologist assistance for cancer-related costs and co-pays  Website: https://www.cancercare.org/financial  [phone]  Mammogram and Ultrasound Assistance (Breast Cancer Assistance Program)  Provides assistance to uninsured and underinsured individuals for breast cancer screening  Website: https://www.stephens-berry.info/  9290304290    Free Colon Cancer Screening Program  Provides assistance to uninsured and underinsured [place] residents based on financial eligibility.  Website: https://www.gomez.com/  770 385 4490    Employment Resources:  Employment Pensions consultant (270) [phone]  7:30 AM - 5?PM Monday - Tuesday  7:30 AM - 4:30 PM Wednesday - Thursday  7:30 AM - 12 PM Friday  CalculatorCard.hu   Labor Force Services (800) ([phone]  8 AM - 5?PM Monday - Friday  MANPOWER ([phone]  www.manpowergroupusa.com   People Lease [phone]  8 AM - 5?PM Monday - Friday  www.people-lease.com   People Plus, Inc. (270) [phone]  8 AM - 12?PM, 1 PM - 5 PM Monday - Friday  www.peopleplusinc.com   Perma Staff Inc. [phone]  8 AM - 5?PM Monday - Thursday  8 AM - 4 PM Friday  www.perma-staff.com   [facility] 2530949422  Tempsplus of [place] (270) [phone]  8 AM - 5?PM  Monday - Friday  Tradesmen International [phone]  7 AM - 6?PM Monday - Friday  https://www.montgomery-brown.info/   WISE STAFFING GROUP (270) ([phone])[phone]  8 AM - 5?PM Monday - Friday  www.wisestaffinggroup.com   The [place]  Office of Unemployment  Information about unemployment and assistance with filing a claim.   GamblingRisk.at  [phone]    [place]  Career Center  Get help with building a resume and searching for jobs.  [address] [place], [place]  [address]  VariantTest.co.uk  [phone]    Coca-Cola to help [place] 's most challenged job seekers overcome barriers that prevent them from finding success in the workforce.  [address] [place], [place] [address]   [phone]  [email or web address]     Other Assistance:  [employer]  Serving single parent households, foster parents, and teens aging out of the foster care system. Provides clothing, home goods, and furniture to those who are most greatly in need of assistance.  [address], [address] [place], [place]  [address]  [phone]    [employer]  Assistance with household items and clothing needs.   [address] [place],   [address]- at [employer]: [email or web address]  [phone]

## 2023-09-03 LAB — ANA SCREEN WITH REFLEX: ANA Ab, IgG ELISA: NOT DETECTED

## 2023-10-05 ENCOUNTER — Encounter: Payer: Medicare (Managed Care) | Attending: Internal Medicine | Primary: Internal Medicine

## 2023-10-06 MED ORDER — FUROSEMIDE 20 MG PO TABS
20 | ORAL_TABLET | Freq: Every day | ORAL | 0 refills | 30.00000 days | Status: DC | PRN
Start: 2023-10-06 — End: 2023-12-15

## 2023-10-12 ENCOUNTER — Encounter

## 2023-10-12 MED ORDER — PANTOPRAZOLE SODIUM 40 MG PO TBEC
40 | ORAL_TABLET | Freq: Two times a day (BID) | ORAL | 1 refills | 30.00000 days | Status: DC
Start: 2023-10-12 — End: 2023-12-15

## 2023-10-12 NOTE — Telephone Encounter (Signed)
 Received fax from pharmacy requesting refill on pts medication(s). Pt was last seen in office on 08/31/2023  and has a follow up scheduled for 11/09/2023. Will send request to  Dr. Albertson  for authorization.     Requested Prescriptions     Pending Prescriptions Disp Refills    pantoprazole  (PROTONIX ) 40 MG tablet [Pharmacy Med Name: PANTOPRAZOLE  SOD DR 40 MG T 40 Tablet] 90 tablet 1     Sig: TAKE ONE TABLET BY MOUTH TWICE A DAY

## 2023-11-09 ENCOUNTER — Ambulatory Visit: Payer: Medicare (Managed Care) | Attending: Internal Medicine | Primary: Internal Medicine

## 2023-11-17 DEATH — deceased

## 2023-12-18 DEATH — deceased
